# Patient Record
Sex: Female | Born: 1965 | Race: Black or African American | Hispanic: No | Marital: Single | Smoking: Never smoker
Health system: Southern US, Community
[De-identification: ages and names within clinical notes are randomized; demographics above are authoritative.]

## PROBLEM LIST (undated history)

## (undated) DIAGNOSIS — I10 Essential (primary) hypertension: Secondary | ICD-10-CM

## (undated) DIAGNOSIS — W3400XA Accidental discharge from unspecified firearms or gun, initial encounter: Secondary | ICD-10-CM

## (undated) DIAGNOSIS — M722 Plantar fascial fibromatosis: Secondary | ICD-10-CM

## (undated) DIAGNOSIS — M1990 Unspecified osteoarthritis, unspecified site: Secondary | ICD-10-CM

## (undated) DIAGNOSIS — E119 Type 2 diabetes mellitus without complications: Secondary | ICD-10-CM

## (undated) HISTORY — PX: TRACHEOSTOMY: SUR1362

## (undated) HISTORY — PX: TRACHEOSTOMY CLOSURE: SHX458

## (undated) HISTORY — PX: CATARACT EXTRACTION, BILATERAL: SHX1313

## (undated) HISTORY — PX: THYROPLASTY: SHX2512

## (undated) HISTORY — PX: TOE SURGERY: SHX1073

---

## 2008-06-28 ENCOUNTER — Emergency Department (HOSPITAL_COMMUNITY): Admission: EM | Admit: 2008-06-28 | Discharge: 2008-06-28 | Payer: Self-pay | Admitting: Family Medicine

## 2008-12-09 ENCOUNTER — Emergency Department (HOSPITAL_COMMUNITY): Admission: EM | Admit: 2008-12-09 | Discharge: 2008-12-09 | Payer: Self-pay | Admitting: Family Medicine

## 2011-01-27 ENCOUNTER — Inpatient Hospital Stay (INDEPENDENT_AMBULATORY_CARE_PROVIDER_SITE_OTHER)
Admission: RE | Admit: 2011-01-27 | Discharge: 2011-01-27 | Disposition: A | Discharge status: Home / Self Care | Payer: Self-pay | Source: Ambulatory Visit | Attending: Family Medicine | Admitting: Family Medicine

## 2011-01-27 DIAGNOSIS — B373 Candidiasis of vulva and vagina: Secondary | ICD-10-CM

## 2011-01-27 LAB — POCT URINALYSIS DIP (DEVICE)
Glucose, UA: 500 mg/dL — AB
Hgb urine dipstick: NEGATIVE
Nitrite: NEGATIVE
Urobilinogen, UA: 0.2 mg/dL (ref 0.0–1.0)

## 2011-01-27 LAB — POCT I-STAT, CHEM 8
Creatinine, Ser: 0.9 mg/dL (ref 0.4–1.2)
HCT: 47 % — ABNORMAL HIGH (ref 36.0–46.0)
Hemoglobin: 16 g/dL — ABNORMAL HIGH (ref 12.0–15.0)
Sodium: 138 mEq/L (ref 135–145)
TCO2: 23 mmol/L (ref 0–100)

## 2011-01-27 LAB — WET PREP, GENITAL: Trich, Wet Prep: NONE SEEN

## 2011-01-28 ENCOUNTER — Ambulatory Visit (HOSPITAL_COMMUNITY): Admission: RE | Admit: 2011-01-28 | Payer: Self-pay | Source: Ambulatory Visit

## 2011-01-28 ENCOUNTER — Inpatient Hospital Stay (INDEPENDENT_AMBULATORY_CARE_PROVIDER_SITE_OTHER)
Admission: RE | Admit: 2011-01-28 | Discharge: 2011-01-28 | Disposition: A | Discharge status: Home / Self Care | Payer: Self-pay | Source: Ambulatory Visit

## 2011-01-28 ENCOUNTER — Ambulatory Visit (INDEPENDENT_AMBULATORY_CARE_PROVIDER_SITE_OTHER): Payer: Self-pay

## 2011-01-28 ENCOUNTER — Ambulatory Visit (HOSPITAL_COMMUNITY): Payer: Self-pay

## 2011-01-28 DIAGNOSIS — IMO0002 Reserved for concepts with insufficient information to code with codable children: Secondary | ICD-10-CM

## 2011-01-28 DIAGNOSIS — W19XXXA Unspecified fall, initial encounter: Secondary | ICD-10-CM

## 2011-01-28 LAB — GC/CHLAMYDIA PROBE AMP, GENITAL: Chlamydia, DNA Probe: NEGATIVE

## 2011-01-28 IMAGING — CR DG KNEE COMPLETE 4+V*L*
4 series · 4 of 4 positions shown · non-contrast
Comparison: None

CLINICAL DATA: Fall, knee pain, abrasions.

LEFT KNEE - COMPLETE 4+ VIEW

[view not recorded (1 of 4)]
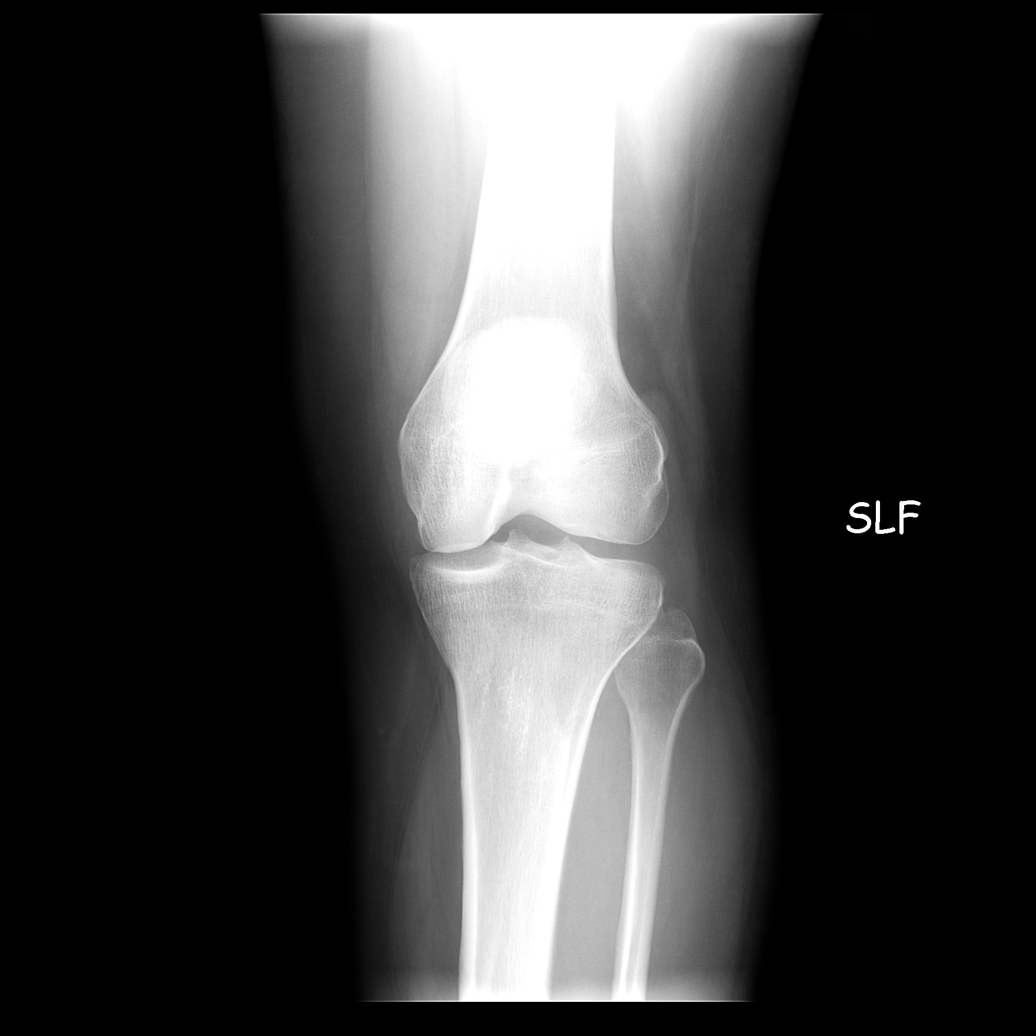

[view not recorded (2 of 4)]
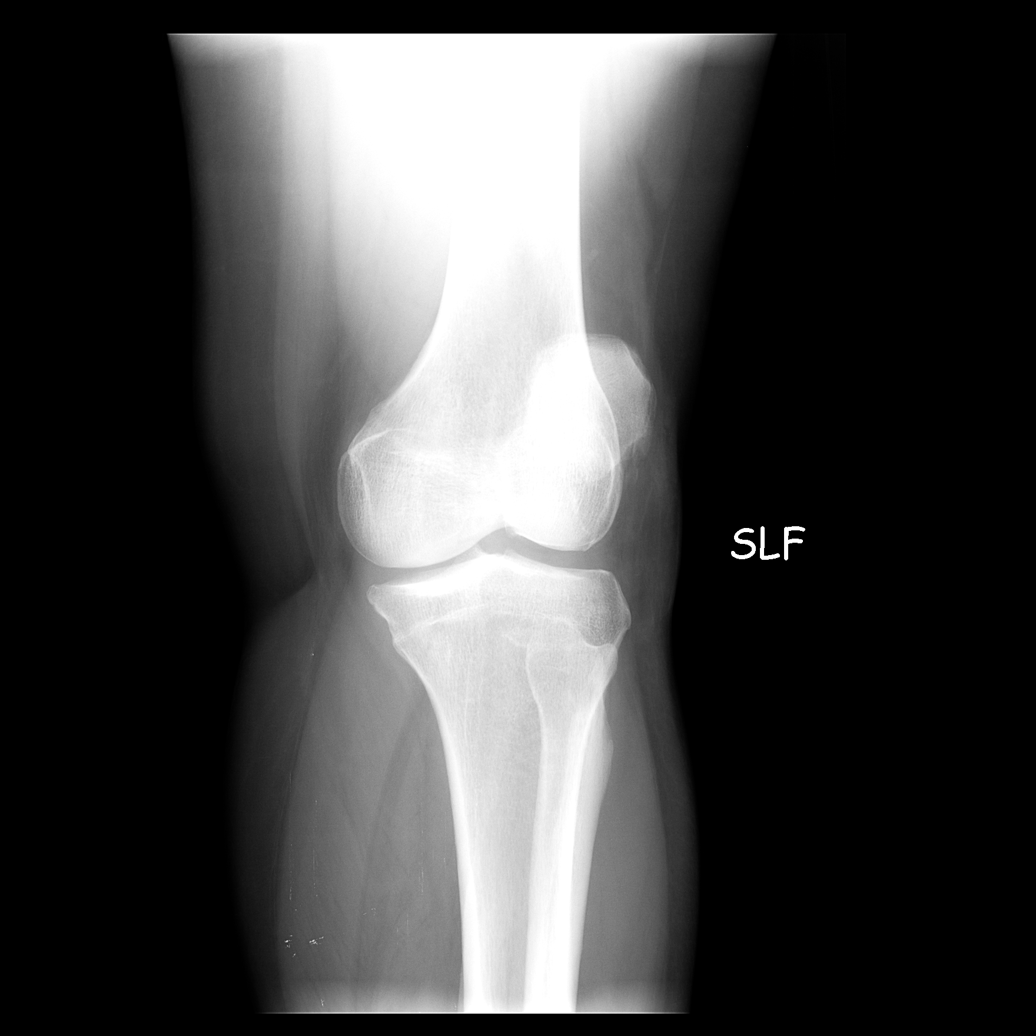

[view not recorded (3 of 4)]
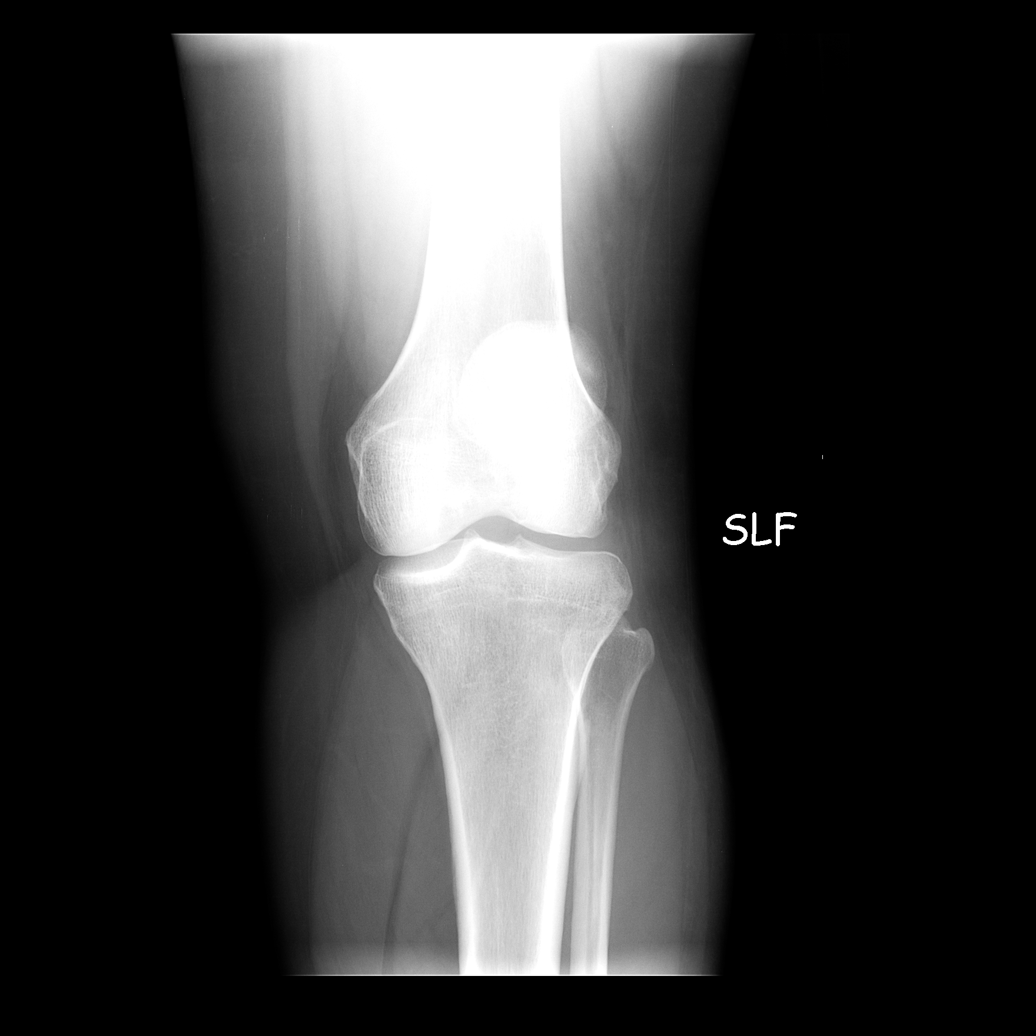

[view not recorded (4 of 4)]
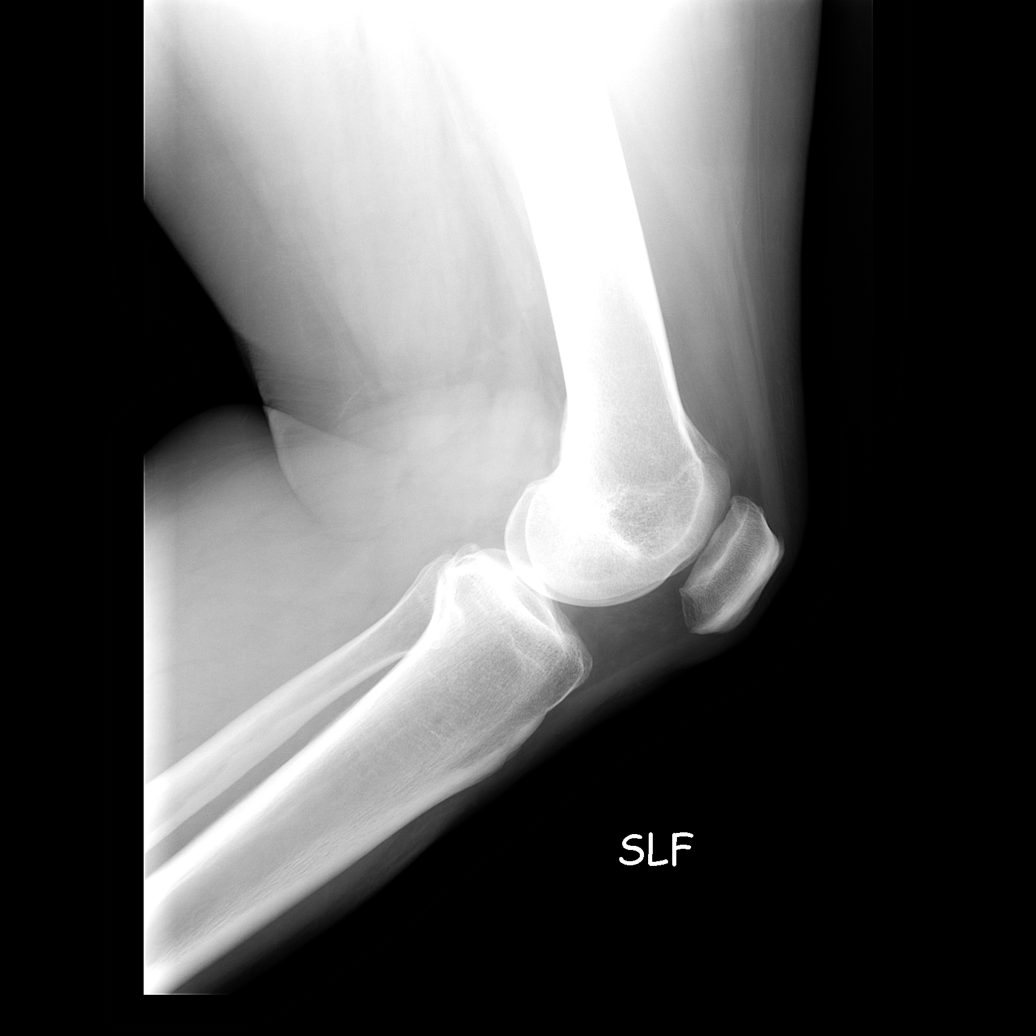

[4 of 4 positions shown; findings below may reference images not displayed]

FINDINGS: No acute bony abnormality.  Specifically, no fracture,
subluxation, or dislocation.  Soft tissues are intact.  No joint
effusion.
IMPRESSION: No acute bony abnormality.

## 2011-01-30 ENCOUNTER — Inpatient Hospital Stay (INDEPENDENT_AMBULATORY_CARE_PROVIDER_SITE_OTHER)
Admission: RE | Admit: 2011-01-30 | Discharge: 2011-01-30 | Disposition: A | Discharge status: Home / Self Care | Payer: Self-pay | Source: Ambulatory Visit | Attending: Emergency Medicine | Admitting: Emergency Medicine

## 2011-01-30 DIAGNOSIS — IMO0002 Reserved for concepts with insufficient information to code with codable children: Secondary | ICD-10-CM

## 2011-03-24 ENCOUNTER — Inpatient Hospital Stay (INDEPENDENT_AMBULATORY_CARE_PROVIDER_SITE_OTHER)
Admission: RE | Admit: 2011-03-24 | Discharge: 2011-03-24 | Disposition: A | Discharge status: Home / Self Care | Payer: Self-pay | Source: Ambulatory Visit | Attending: Family Medicine | Admitting: Family Medicine

## 2011-03-24 DIAGNOSIS — L0291 Cutaneous abscess, unspecified: Secondary | ICD-10-CM

## 2011-06-03 ENCOUNTER — Inpatient Hospital Stay (INDEPENDENT_AMBULATORY_CARE_PROVIDER_SITE_OTHER)
Admission: RE | Admit: 2011-06-03 | Discharge: 2011-06-03 | Disposition: A | Discharge status: Home / Self Care | Payer: Self-pay | Source: Ambulatory Visit | Attending: Emergency Medicine | Admitting: Emergency Medicine

## 2011-06-03 DIAGNOSIS — K089 Disorder of teeth and supporting structures, unspecified: Secondary | ICD-10-CM

## 2011-06-03 DIAGNOSIS — E119 Type 2 diabetes mellitus without complications: Secondary | ICD-10-CM

## 2011-06-03 DIAGNOSIS — L0292 Furuncle, unspecified: Secondary | ICD-10-CM

## 2011-06-03 DIAGNOSIS — N760 Acute vaginitis: Secondary | ICD-10-CM

## 2011-06-03 LAB — POCT URINALYSIS DIP (DEVICE)
Glucose, UA: 500 mg/dL — AB
Ketones, ur: NEGATIVE mg/dL
Protein, ur: NEGATIVE mg/dL
Specific Gravity, Urine: 1.015 (ref 1.005–1.030)

## 2011-06-03 LAB — GLUCOSE, CAPILLARY: Glucose-Capillary: 326 mg/dL — ABNORMAL HIGH (ref 70–99)

## 2011-06-04 LAB — POCT PREGNANCY, URINE: Preg Test, Ur: NEGATIVE

## 2012-04-04 ENCOUNTER — Emergency Department (INDEPENDENT_AMBULATORY_CARE_PROVIDER_SITE_OTHER)
Admission: EM | Admit: 2012-04-04 | Discharge: 2012-04-04 | Disposition: A | Discharge status: Other Acute Inpatient Hospital | Payer: Self-pay | Source: Home / Self Care | Attending: Emergency Medicine | Admitting: Emergency Medicine

## 2012-04-04 ENCOUNTER — Encounter (HOSPITAL_COMMUNITY): Payer: Self-pay | Admitting: Emergency Medicine

## 2012-04-04 ENCOUNTER — Encounter (HOSPITAL_COMMUNITY): Payer: Self-pay

## 2012-04-04 ENCOUNTER — Emergency Department (HOSPITAL_COMMUNITY)
Admission: EM | Admit: 2012-04-04 | Discharge: 2012-04-04 | Disposition: A | Discharge status: Home / Self Care | Payer: Self-pay | Attending: Emergency Medicine | Admitting: Emergency Medicine

## 2012-04-04 ENCOUNTER — Emergency Department (HOSPITAL_COMMUNITY)
Admission: EM | Admit: 2012-04-04 | Discharge: 2012-04-04 | Disposition: A | Discharge status: Other Acute Inpatient Hospital | Payer: Self-pay | Source: Home / Self Care

## 2012-04-04 DIAGNOSIS — I10 Essential (primary) hypertension: Secondary | ICD-10-CM | POA: Insufficient documentation

## 2012-04-04 DIAGNOSIS — R202 Paresthesia of skin: Secondary | ICD-10-CM

## 2012-04-04 DIAGNOSIS — R209 Unspecified disturbances of skin sensation: Secondary | ICD-10-CM | POA: Insufficient documentation

## 2012-04-04 DIAGNOSIS — R7309 Other abnormal glucose: Secondary | ICD-10-CM | POA: Insufficient documentation

## 2012-04-04 DIAGNOSIS — R739 Hyperglycemia, unspecified: Secondary | ICD-10-CM

## 2012-04-04 HISTORY — DX: Accidental discharge from unspecified firearms or gun, initial encounter: W34.00XA

## 2012-04-04 LAB — CBC
HCT: 42.5 % (ref 36.0–46.0)
Hemoglobin: 13.9 g/dL (ref 12.0–15.0)
RBC: 5.8 MIL/uL — ABNORMAL HIGH (ref 3.87–5.11)
WBC: 8.2 10*3/uL (ref 4.0–10.5)

## 2012-04-04 LAB — DIFFERENTIAL
Basophils Absolute: 0 10*3/uL (ref 0.0–0.1)
Basophils Relative: 0 % (ref 0–1)
Lymphocytes Relative: 13 % (ref 12–46)
Monocytes Relative: 2 % — ABNORMAL LOW (ref 3–12)
Neutro Abs: 6.9 10*3/uL (ref 1.7–7.7)

## 2012-04-04 LAB — POCT I-STAT, CHEM 8
Chloride: 102 mEq/L (ref 96–112)
HCT: 50 % — ABNORMAL HIGH (ref 36.0–46.0)
Potassium: 3.9 mEq/L (ref 3.5–5.1)

## 2012-04-04 MED ORDER — METFORMIN HCL 500 MG PO TABS: 500.0000 mg | ORAL_TABLET | Freq: Two times a day (BID) | ORAL | Status: DC

## 2012-04-04 MED ORDER — PREDNISONE 20 MG PO TABS
40.0000 mg | ORAL_TABLET | Freq: Once | ORAL | Status: AC
  Administered 2012-04-04: 40 mg via ORAL

## 2012-04-04 MED ORDER — PREDNISONE 20 MG PO TABS
ORAL_TABLET | ORAL | Status: AC
  Filled 2012-04-04: qty 2

## 2012-04-04 MED ORDER — HYDROCHLOROTHIAZIDE 25 MG PO TABS: 25.0000 mg | ORAL_TABLET | Freq: Every day | ORAL | Status: DC

## 2012-04-04 NOTE — Discharge Instructions (Signed)
Arterial Hypertension Arterial hypertension (high blood pressure) is a condition of elevated pressure in your blood vessels. Hypertension over a long period of time is a risk factor for strokes, heart attacks, and heart failure. It is also the leading cause of kidney (renal) failure.  CAUSES   In Adults -- Over 90% of all hypertension has no known cause. This is called essential or primary hypertension. In the other 10% of people with hypertension, the increase in blood pressure is caused by another disorder. This is called secondary hypertension. Important causes of secondary hypertension are:   Heavy alcohol use.   Obstructive sleep apnea.   Hyperaldosterosim (Conn's syndrome).   Steroid use.   Chronic kidney failure.   Hyperparathyroidism.   Medications.   Renal artery stenosis.   Pheochromocytoma.   Cushing's disease.   Coarctation of the aorta.   Scleroderma renal crisis.   Licorice (in excessive amounts).   Drugs (cocaine, methamphetamine).  Your caregiver can explain any items above that apply to you.  In Children -- Secondary hypertension is more common and should always be considered.   Pregnancy -- Few women of childbearing age have high blood pressure. However, up to 10% of them develop hypertension of pregnancy. Generally, this will not harm the woman. It Even be a sign of 3 complications of pregnancy: preeclampsia, HELLP syndrome, and eclampsia. Follow up and control with medication is necessary.  SYMPTOMS   This condition normally does not produce any noticeable symptoms. It is usually found during a routine exam.   Malignant hypertension is a late problem of high blood pressure. It Monger have the following symptoms:   Headaches.   Blurred vision.   End-organ damage (this means your kidneys, heart, lungs, and other organs are being damaged).   Stressful situations can increase the blood pressure. If a person with normal blood pressure has their blood  pressure go up while being seen by their caregiver, this is often termed "white coat hypertension." Its importance is not known. It Alkire be related with eventually developing hypertension or complications of hypertension.   Hypertension is often confused with mental tension, stress, and anxiety.  DIAGNOSIS  The diagnosis is made by 3 separate blood pressure measurements. They are taken at least 1 week apart from each other. If there is organ damage from hypertension, the diagnosis Lemire be made without repeat measurements. Hypertension is usually identified by having blood pressure readings:  Above 140/90 mmHg measured in both arms, at 3 separate times, over a couple weeks.   Over 130/80 mmHg should be considered a risk factor and Grisanti require treatment in patients with diabetes.  Blood pressure readings over 120/80 mmHg are called "pre-hypertension" even in non-diabetic patients. To get a true blood pressure measurement, use the following guidelines. Be aware of the factors that can alter blood pressure readings.  Take measurements at least 1 hour after caffeine.   Take measurements 30 minutes after smoking and without any stress. This is another reason to quit smoking - it raises your blood pressure.   Use a proper cuff size. Ask your caregiver if you are not sure about your cuff size.   Most home blood pressure cuffs are automatic. They will measure systolic and diastolic pressures. The systolic pressure is the pressure reading at the start of sounds. Diastolic pressure is the pressure at which the sounds disappear. If you are elderly, measure pressures in multiple postures. Try sitting, lying or standing.   Sit at rest for a minimum of   5 minutes before taking measurements.   You should not be on any medications like decongestants. These are found in many cold medications.   Record your blood pressure readings and review them with your caregiver.  If you have hypertension:  Your caregiver  may do tests to be sure you do not have secondary hypertension (see "causes" above).   Your caregiver may also look for signs of metabolic syndrome. This is also called Syndrome X or Insulin Resistance Syndrome. You may have this syndrome if you have type 2 diabetes, abdominal obesity, and abnormal blood lipids in addition to hypertension.   Your caregiver will take your medical and family history and perform a physical exam.   Diagnostic tests may include blood tests (for glucose, cholesterol, potassium, and kidney function), a urinalysis, or an EKG. Other tests may also be necessary depending on your condition.  PREVENTION  There are important lifestyle issues that you can adopt to reduce your chance of developing hypertension:  Maintain a normal weight.   Limit the amount of salt (sodium) in your diet.   Exercise often.   Limit alcohol intake.   Get enough potassium in your diet. Discuss specific advice with your caregiver.   Follow a DASH diet (dietary approaches to stop hypertension). This diet is rich in fruits, vegetables, and low-fat dairy products, and avoids certain fats.  PROGNOSIS  Essential hypertension cannot be cured. Lifestyle changes and medical treatment can lower blood pressure and reduce complications. The prognosis of secondary hypertension depends on the underlying cause. Many people whose hypertension is controlled with medicine or lifestyle changes can live a normal, healthy life.  RISKS AND COMPLICATIONS  While high blood pressure alone is not an illness, it often requires treatment due to its short- and long-term effects on many organs. Hypertension increases your risk for:  CVAs or strokes (cerebrovascular accident).   Heart failure due to chronically high blood pressure (hypertensive cardiomyopathy).   Heart attack (myocardial infarction).   Damage to the retina (hypertensive retinopathy).   Kidney failure (hypertensive nephropathy).  Your caregiver can  explain list items above that apply to you. Treatment of hypertension can significantly reduce the risk of complications. TREATMENT   For overweight patients, weight loss and regular exercise are recommended. Physical fitness lowers blood pressure.   Mild hypertension is usually treated with diet and exercise. A diet rich in fruits and vegetables, fat-free dairy products, and foods low in fat and salt (sodium) can help lower blood pressure. Decreasing salt intake decreases blood pressure in a 1/3 of people.   Stop smoking if you are a smoker.  The steps above are highly effective in reducing blood pressure. While these actions are easy to suggest, they are difficult to achieve. Most patients with moderate or severe hypertension end up requiring medications to bring their blood pressure down to a normal level. There are several classes of medications for treatment. Blood pressure pills (antihypertensives) will lower blood pressure by their different actions. Lowering the blood pressure by 10 mmHg may decrease the risk of complications by as much as 25%. The goal of treatment is effective blood pressure control. This will reduce your risk for complications. Your caregiver will help you determine the best treatment for you according to your lifestyle. What is excellent treatment for one person, may not be for you. HOME CARE INSTRUCTIONS   Do not smoke.   Follow the lifestyle changes outlined in the "Prevention" section.   If you are on medications, follow the directions   carefully. Blood pressure medications must be taken as prescribed. Skipping doses reduces their benefit. It also puts you at risk for problems.   Follow up with your caregiver, as directed.   If you are asked to monitor your blood pressure at home, follow the guidelines in the "Diagnosis" section above.  SEEK MEDICAL CARE IF:   You think you are having medication side effects.   You have recurrent headaches or lightheadedness.    You have swelling in your ankles.   You have trouble with your vision.  SEEK IMMEDIATE MEDICAL CARE IF:   You have sudden onset of chest pain or pressure, difficulty breathing, or other symptoms of a heart attack.   You have a severe headache.   You have symptoms of a stroke (such as sudden weakness, difficulty speaking, difficulty walking).  MAKE SURE YOU:   Understand these instructions.   Will watch your condition.   Will get help right away if you are not doing well or get worse.  Document Released: 10/18/2005 Document Revised: 10/07/2011 Document Reviewed: 05/18/2007 [facility] Patient Information 2012 [place], [place].Hyperglycemia Hyperglycemia occurs when the glucose (sugar) in your blood is too high. Hyperglycemia can happen for many reasons, but it most often happens to people who do not know they have diabetes or are not managing their diabetes properly.  CAUSES  Whether you have diabetes or not, there are other causes of hyperglycemia. Hyperglycemia can occur when you have diabetes, but it can also occur in other situations that you might not be as aware of, such as: Diabetes  If you have diabetes and are having problems controlling your blood glucose, hyperglycemia could occur because of some of the following reasons:   Not following your meal plan.   Not taking your diabetes medications or not taking it properly.   Exercising less or doing less activity than you normally do.   Being sick.  Pre-diabetes  This cannot be ignored. Before people develop Type 2 diabetes, they almost always have "pre-diabetes." This is when your blood glucose levels are higher than normal, but not yet high enough to be diagnosed as diabetes. Research has shown that some long-term damage to the body, especially the heart and circulatory system, may already be occurring during pre-diabetes. If you take action to manage your blood glucose when you have pre-diabetes, you may delay or prevent  Type 2 diabetes from developing.  Stress  If you have diabetes, you may be "diet" controlled or on oral medications or insulin to control your diabetes. However, you may find that your blood glucose is higher than usual in the hospital whether you have diabetes or not. This is often referred to as "stress hyperglycemia." Stress can elevate your blood glucose. This happens because of hormones put out by the body during times of stress. If stress has been the cause of your high blood glucose, it can be followed regularly by your caregiver. That way he/she can make sure your hyperglycemia does not continue to get worse or progress to diabetes.  Steroids  Steroids are medications that act on the infection fighting system (immune system) to block inflammation or infection. One side effect can be a rise in blood glucose. Most people can produce enough extra insulin to allow for this rise, but for those who cannot, steroids make blood glucose levels go even higher. It is not unusual for steroid treatments to "uncover" diabetes that is developing. It is not always possible to determine if the hyperglycemia will go away  after the steroids are stopped. A special blood test called an A1c is sometimes done to determine if your blood glucose was elevated before the steroids were started.  SYMPTOMS  Thirsty.   Frequent urination.   Dry mouth.   Blurred vision.   Tired or fatigue.   Weakness.   Sleepy.   Tingling in feet or leg.  DIAGNOSIS  Diagnosis is made by monitoring blood glucose in one or all of the following ways:  A1c test. This is a chemical found in your blood.   Fingerstick blood glucose monitoring.   Laboratory results.  TREATMENT  First, knowing the cause of the hyperglycemia is important before the hyperglycemia can be treated. Treatment may include, but is not be limited to:  Education.   Change or adjustment in medications.   Change or adjustment in meal plan.   Treatment  for an illness, infection, etc.   More frequent blood glucose monitoring.   Change in exercise plan.   Decreasing or stopping steroids.   Lifestyle changes.  HOME CARE INSTRUCTIONS   Test your blood glucose as directed.   Exercise regularly. Your caregiver will give you instructions about exercise. Pre-diabetes or diabetes which comes on with stress is helped by exercising.   Eat wholesome, balanced meals. Eat often and at regular, fixed times. Your caregiver or nutritionist will give you a meal plan to guide your sugar intake.   Being at an ideal weight is important. If needed, losing as little as 10 to 15 pounds may help improve blood glucose levels.  SEEK MEDICAL CARE IF:   You have questions about medicine, activity, or diet.   You continue to have symptoms (problems such as increased thirst, urination, or weight gain).  SEEK IMMEDIATE MEDICAL CARE IF:   You are vomiting or have diarrhea.   Your breath smells fruity.   You are breathing faster or slower.   You are very sleepy or incoherent.   You have numbness, tingling, or pain in your feet or hands.   You have chest pain.   Your symptoms get worse even though you have been following your caregiver's orders.   If you have any other questions or concerns.  Document Released: 04/13/2001 Document Revised: 10/07/2011 Document Reviewed: 06/09/2009 [facility] Patient Information 2012 [place], [place].Paresthesia Paresthesia is an abnormal burning or prickling sensation. This sensation is generally felt in the hands, arms, legs, or feet. However, it may occur in any part of the body. It is usually not painful. The feeling may be described as:  Tingling or numbness.   "Pins and needles."   Skin crawling.   Buzzing.   Limbs "falling asleep."   Itching.  Most people experience temporary (transient) paresthesia at some time in their lives. CAUSES  Paresthesia may occur when you breathe too quickly (hyperventilation).  It can also occur without any apparent cause. Commonly, paresthesia occurs when pressure is placed on a nerve. The feeling quickly goes away once the pressure is removed. For some people, however, paresthesia is a long-lasting (chronic) condition caused by an underlying disorder. The underlying disorder may be:  A traumatic, direct injury to nerves. Examples include a:   Broken (fractured) neck.   Fractured skull.   A disorder affecting the brain and spinal cord (central nervous system). Examples include:   Transverse myelitis.   Encephalitis.   Transient ischemic attack.   Multiple sclerosis.   Stroke.   Tumor or blood vessel problems, such as an arteriovenous malformation pressing against the brain  or spinal cord.   A condition that damages the peripheral nerves (peripheral neuropathy). Peripheral nerves are not part of the brain and spinal cord. These conditions include:   Diabetes.   Peripheral vascular disease.   Nerve entrapment syndromes, such as carpal tunnel syndrome.   Shingles.   Hypothyroidism.   Vitamin B12 deficiencies.   Alcoholism.   Heavy metal poisoning (lead, arsenic).   Rheumatoid arthritis.   Systemic lupus erythematosus.  DIAGNOSIS  Your caregiver will attempt to find the underlying cause of your paresthesia. Your caregiver may:  Take your medical history.   Perform a physical exam.   Order various lab tests.   Order imaging tests.  TREATMENT  Treatment for paresthesia depends on the underlying cause. HOME CARE INSTRUCTIONS  Avoid drinking alcohol.   You may consider massage or acupuncture to help relieve your symptoms.   Keep all follow-up appointments as directed by your caregiver.  SEEK IMMEDIATE MEDICAL CARE IF:   You feel weak.   You have trouble walking or moving.   You have problems with speech or vision.   You feel confused.   You cannot control your bladder or bowel movements.   You feel numbness after an injury.    You faint.   Your burning or prickling feeling gets worse when walking.   You have pain, cramps, or dizziness.   You develop a rash.  MAKE SURE YOU:  Understand these instructions.   Will watch your condition.   Will get help right away if you are not doing well or get worse.  Document Released: 10/08/2002 Document Revised: 10/07/2011 Document Reviewed: 07/09/2011 [facility] Patient Information 2012 [place], [place].  RESOURCE GUIDE  Chronic Pain Problems: Contact [facility]  [phone] Patients need to be referred by their primary care doctor.  Insufficient Money for Medicine: Contact United Way:  call "211" or [facility] (380) [phone].  No Primary Care Doctor: - Call [facility] Connect  [phone] - can help you locate a primary care doctor that  accepts your insurance, provides certain services, etc. - Physician Referral [phone]  Agencies that provide inexpensive medical care: - [facility]  [phone] - [facility]  [phone] - [facility]  [phone] [facility]  [phone] - Planned Parenthood  [phone] [facility]  [phone]  Medicaid-accepting [facility] Providers: - [facility]- [address]  [phone], Mon-Fri 9am-7pm, Sat 9am-1pm - [facility]- [address] [place], [address] [place]  [phone] - [facility]- [address] [place]  [phone] [facility]- [address]  [phone] - Renaye Rakers- [address] [place], [address], [phone]  Only accepts [place] patients after they have their name  applied to their card  Self Pay (no insurance) in [place]: - Sickle Cell Patients: Dr Willey Blade, [facility]  [address] [place], [phone] - [facility]- [address] [place]  [phone]       [facility]  [place]- [address]       -     [facility]- see information above (Speak to [employer] if you do not have insurance)       -  [facility]- [address] [place], [phone]       -  [facility]- [address],  [phone]       -  [facility]- [address] [phone]       -  Dr Julio Sicks-  [address], [place], [phone]       -  [facility]- [address], [phone]       -  [facility]- [address] [phone], also [address] [phone]       -    [facility]- [address] [place], [phone], 1st & 3rd Saturday   every month, 10am-1pm  1) Find a Doctor and Pay Out of Pocket Although you won't have to find out who is covered by your insurance plan, it is a good idea to ask around and get recommendations. You will then need to call the office and see if the doctor you have chosen will accept you as a new patient and what types of options they offer for patients who are self-pay. Some doctors offer discounts or will set up payment plans for their patients who do not have insurance, but you will need to ask so you aren't surprised when you get to your appointment.  2) Contact Your Local Health Department Not all health departments have doctors that can see patients for sick visits, but many do, so it is worth a call to see if yours does. If you don't know where your local health department is, you can check in your phone book. The CDC also has a tool to help you locate your state's health department, and many state websites also have listings of all of their local health departments.  3) Find a Walk-in Clinic If your illness is not likely to be very severe or complicated, you may want to try a walk in clinic. These are popping up all over the country in pharmacies, drugstores, and shopping centers. They're usually staffed by nurse practitioners or physician assistants that have  been trained to treat common illnesses and complaints. They're usually fairly quick and inexpensive. However, if you have serious medical issues or chronic medical problems, these are probably not your best option  STD Testing - [facility] Department of [facility], STD Clinic, [address] [place], phone [phone] or [phone].  Monday - Friday, call for an appointment. [facility] Department of [employer], STD Clinic, [address], [place], phone [phone] or [phone].  Monday - Friday, call for an appointment.  Abuse/Neglect: [facility] Child Abuse Hotline 325-393-5853 [facility] Child Abuse Hotline (425) 886-1434 (After Hours)  Emergency Shelter:  [facility] (815)832-1938  Maternity Homes: - [facility] (732) 679-0652 - [facility] [phone]  MRSA Hotline #:   787-870-6399  [facility] Resources  [facility]  [employer] [facility] Dept. [address]                 [address]         [address] [place] [address]  [address]  [facility] Phone:  [phone]                                  Phone:  [phone]                   Phone:  [phone]  [facility], [phone] - [facility] - [phone]       -     [facility] in [place], [address]                                  [phone], [facility] Child Abuse Hotline (804)010-0292 or 763 695 1685 (After Hours)   Behavioral Health Services  Substance Abuse Resources: - [facility] Drug [facility]  (807)392-3797 - [facility] [phone] - [facility] [phone] Floydene Flock (443) 522-6144 - Residential & Outpatient Substance Abuse Program   [phone]  Psychological Services: [facility]  [phone] [facility] Services  [phone] - [facility], [phone] [place]. [address], [place], ACCESS LINE: (314) 414-4143 or (781)162-6364, [employer]  Dental Assistance  If unable to pay or uninsured, contact:  [facility] or [facility]. to become qualified for the adult dental clinic.  Patients with Medicaid: [facility] [phone], [phone] [address] [phone]  If unable to pay, or uninsured, contact [facility] 480-769-0641) or [facility] Department [phone])[phone] in [place], [phone] in [facility]) to become qualified for the adult dental clinic  Other Low-Cost Community Dental Services: - [facility]- [address] [place], [place], [place], [address], [phone], [phone], 2nd and 4th Thursday of the month at 6:30am.  10 clients each day by appointment, can sometimes see walk-in patients if someone does not show for an appointment. [place] [facility]- [address] Ether Griffins [place], [place], [address], [phone] - [facility]- [address], [place], [place], [address], [phone] - [facility]- [phone] [facility] Health Department- [phone] [facility] [phone] -

## 2012-04-04 NOTE — ED Notes (Signed)
Pt sent from [facility] with generalized body numbness; pt with no obvious neuro deficits at present
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
## 2012-04-04 NOTE — ED Notes (Signed)
Waiting for transport to main ED via shuttle

## 2012-04-04 NOTE — ED Notes (Addendum)
C/o generalized numbness when she woke this AM, took warm shower in an attempt to relieve syx, but still feels generalized numbness, denies focal numb sensation. MAEW, no facial weakness on grimace , ambulatory ; denies injury; states she had been having pain in her arms, but denies any at present Concerned about "bad tooth on right"

## 2012-04-04 NOTE — ED Notes (Signed)
Kathleen Mcmillan, emt reported patient wanted to speak to an rn.  generalized numbness today.  Patient reports some nausea, denies pain.  C/o of heartrate being fast, plapable radial pulse 100.  Patient denies recent illness.  Left face numbness.  Patient states "it may be anxiety".  Patient's phone ringing frequently.  Patient requesting to answer her phone during rn assessment.

## 2014-10-13 ENCOUNTER — Encounter (HOSPITAL_COMMUNITY): Payer: Self-pay

## 2014-10-13 ENCOUNTER — Emergency Department (INDEPENDENT_AMBULATORY_CARE_PROVIDER_SITE_OTHER)
Admission: EM | Admit: 2014-10-13 | Discharge: 2014-10-13 | Disposition: A | Discharge status: Home / Self Care | Payer: Self-pay | Source: Home / Self Care | Attending: Emergency Medicine | Admitting: Emergency Medicine

## 2014-10-13 DIAGNOSIS — T148XXA Other injury of unspecified body region, initial encounter: Secondary | ICD-10-CM

## 2014-10-13 DIAGNOSIS — T148 Other injury of unspecified body region: Secondary | ICD-10-CM

## 2014-10-13 DIAGNOSIS — R0981 Nasal congestion: Secondary | ICD-10-CM

## 2014-10-13 MED ORDER — TRAMADOL HCL 50 MG PO TABS: 50.0000 mg | ORAL_TABLET | Freq: Four times a day (QID) | ORAL | Status: DC | PRN

## 2014-10-13 MED ORDER — IPRATROPIUM BROMIDE 0.06 % NA SOLN: 2.0000 | Freq: Four times a day (QID) | NASAL | Status: DC

## 2014-10-13 MED ORDER — MELOXICAM 15 MG PO TABS: 15.0000 mg | ORAL_TABLET | Freq: Every day | ORAL | Status: DC

## 2014-10-13 MED ORDER — CETIRIZINE HCL 10 MG PO TABS: 10.0000 mg | ORAL_TABLET | Freq: Every day | ORAL | Status: DC

## 2014-10-13 MED ORDER — AMOXICILLIN-POT CLAVULANATE 875-125 MG PO TABS: 1.0000 | ORAL_TABLET | Freq: Two times a day (BID) | ORAL | Status: DC

## 2014-10-13 MED ORDER — CYCLOBENZAPRINE HCL 10 MG PO TABS: 10.0000 mg | ORAL_TABLET | Freq: Three times a day (TID) | ORAL | Status: DC | PRN

## 2014-10-13 NOTE — Discharge Instructions (Signed)
You have muscle strain and spasm from the fall. Take flexeril 3 times a day as needed for muscle spasm. Take meloxicam daily for the next week, then as needed. Use Tramadol as needed for pain. Use the Zyrtec and nasal spray for the sinus issue. If the sinuses do not resolve in the next 4-5 days, you can fill the antibiotic. Follow up as needed.
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
## 2014-10-13 NOTE — ED Notes (Signed)
States she fell Tuesday, landed on her left side, chipped a tooth on left side of mouth, bit her tongue, and having sinus pain on right side

## 2014-12-07 ENCOUNTER — Emergency Department (INDEPENDENT_AMBULATORY_CARE_PROVIDER_SITE_OTHER)
Admission: EM | Admit: 2014-12-07 | Discharge: 2014-12-07 | Disposition: A | Discharge status: Home / Self Care | Payer: Self-pay | Source: Home / Self Care | Attending: Family Medicine | Admitting: Family Medicine

## 2014-12-07 ENCOUNTER — Encounter (HOSPITAL_COMMUNITY): Payer: Self-pay | Admitting: *Deleted

## 2014-12-07 DIAGNOSIS — K047 Periapical abscess without sinus: Secondary | ICD-10-CM

## 2014-12-07 HISTORY — DX: Essential (primary) hypertension: I10

## 2014-12-07 MED ORDER — FLUCONAZOLE 150 MG PO TABS: 150.0000 mg | ORAL_TABLET | Freq: Every day | ORAL | Status: DC

## 2014-12-07 MED ORDER — CLINDAMYCIN HCL 300 MG PO CAPS: 300.0000 mg | ORAL_CAPSULE | Freq: Three times a day (TID) | ORAL | Status: DC

## 2014-12-07 MED ORDER — IBUPROFEN 800 MG PO TABS
ORAL_TABLET | ORAL | Status: AC
  Filled 2014-12-07: qty 1

## 2014-12-07 MED ORDER — IBUPROFEN 800 MG PO TABS
800.0000 mg | ORAL_TABLET | Freq: Once | ORAL | Status: AC
  Administered 2014-12-07: 800 mg via ORAL

## 2014-12-07 NOTE — ED Notes (Signed)
Has been having left lower toothache; saw dentist 4 days ago - was told she needs to return for cleaning and they would like to pull tooth, but did not place on abx.  3 days ago crown fell off, has been having worse pain and now swelling since.  Has been using H2O2 and salt water gargles.

## 2014-12-07 NOTE — Discharge Instructions (Signed)
You have a tooth and gum infection. Please start taking the antibiotics, and taken for the full 10 days. Please use ibuprofen 600 mg every 6 hours for pain and swelling. Please use a daily mouthwash. Please follow-up with the dentist regarding tooth extraction. Please use the Diflucan if you develop a yeast infection.
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
## 2016-01-11 ENCOUNTER — Encounter (HOSPITAL_COMMUNITY): Payer: Self-pay | Admitting: Emergency Medicine

## 2016-01-11 ENCOUNTER — Emergency Department (HOSPITAL_COMMUNITY)
Admission: EM | Admit: 2016-01-11 | Discharge: 2016-01-11 | Disposition: A | Discharge status: Home / Self Care | Payer: Self-pay | Attending: Emergency Medicine | Admitting: Emergency Medicine

## 2016-01-11 DIAGNOSIS — Z792 Long term (current) use of antibiotics: Secondary | ICD-10-CM | POA: Insufficient documentation

## 2016-01-11 DIAGNOSIS — Z87828 Personal history of other (healed) physical injury and trauma: Secondary | ICD-10-CM | POA: Insufficient documentation

## 2016-01-11 DIAGNOSIS — R079 Chest pain, unspecified: Secondary | ICD-10-CM | POA: Insufficient documentation

## 2016-01-11 DIAGNOSIS — K0381 Cracked tooth: Secondary | ICD-10-CM | POA: Insufficient documentation

## 2016-01-11 DIAGNOSIS — E1165 Type 2 diabetes mellitus with hyperglycemia: Secondary | ICD-10-CM | POA: Insufficient documentation

## 2016-01-11 DIAGNOSIS — K002 Abnormalities of size and form of teeth: Secondary | ICD-10-CM | POA: Insufficient documentation

## 2016-01-11 DIAGNOSIS — Z79899 Other long term (current) drug therapy: Secondary | ICD-10-CM | POA: Insufficient documentation

## 2016-01-11 DIAGNOSIS — Z7982 Long term (current) use of aspirin: Secondary | ICD-10-CM | POA: Insufficient documentation

## 2016-01-11 DIAGNOSIS — Z880 Allergy status to penicillin: Secondary | ICD-10-CM | POA: Insufficient documentation

## 2016-01-11 DIAGNOSIS — Z7984 Long term (current) use of oral hypoglycemic drugs: Secondary | ICD-10-CM | POA: Insufficient documentation

## 2016-01-11 DIAGNOSIS — I10 Essential (primary) hypertension: Secondary | ICD-10-CM | POA: Insufficient documentation

## 2016-01-11 DIAGNOSIS — Z791 Long term (current) use of non-steroidal anti-inflammatories (NSAID): Secondary | ICD-10-CM | POA: Insufficient documentation

## 2016-01-11 DIAGNOSIS — K029 Dental caries, unspecified: Secondary | ICD-10-CM | POA: Insufficient documentation

## 2016-01-11 DIAGNOSIS — D849 Immunodeficiency, unspecified: Secondary | ICD-10-CM | POA: Insufficient documentation

## 2016-01-11 LAB — BASIC METABOLIC PANEL
Anion gap: 12 (ref 5–15)
BUN: 9 mg/dL (ref 6–20)
CALCIUM: 9.3 mg/dL (ref 8.9–10.3)
CO2: 26 mmol/L (ref 22–32)
Chloride: 98 mmol/L — ABNORMAL LOW (ref 101–111)
Creatinine, Ser: 0.76 mg/dL (ref 0.44–1.00)
GFR calc Af Amer: >60 mL/min (ref >60)
GLUCOSE: 384 mg/dL — AB (ref 65–99)
Potassium: 3.9 mmol/L (ref 3.5–5.1)
SODIUM: 136 mmol/L (ref 135–145)

## 2016-01-11 LAB — URINALYSIS, ROUTINE W REFLEX MICROSCOPIC
BILIRUBIN URINE: NEGATIVE
Glucose, UA: >1000 mg/dL — AB
Hgb urine dipstick: NEGATIVE
KETONES UR: NEGATIVE mg/dL
LEUKOCYTES UA: NEGATIVE
NITRITE: NEGATIVE
PROTEIN: NEGATIVE mg/dL
Specific Gravity, Urine: 1.046 — ABNORMAL HIGH (ref 1.005–1.030)
pH: 6.5 (ref 5.0–8.0)

## 2016-01-11 LAB — CBC
HCT: 44.3 % (ref 36.0–46.0)
Hemoglobin: 13.9 g/dL (ref 12.0–15.0)
MCH: 22.9 pg — ABNORMAL LOW (ref 26.0–34.0)
MCHC: 31.4 g/dL (ref 30.0–36.0)
MCV: 73.1 fL — ABNORMAL LOW (ref 78.0–100.0)
PLATELETS: 285 10*3/uL (ref 150–400)
RBC: 6.06 MIL/uL — ABNORMAL HIGH (ref 3.87–5.11)
RDW: 13.5 % (ref 11.5–15.5)
WBC: 6.5 10*3/uL (ref 4.0–10.5)

## 2016-01-11 LAB — URINE MICROSCOPIC-ADD ON
BACTERIA UA: NONE SEEN
RBC / HPF: NONE SEEN RBC/hpf (ref 0–5)
WBC, UA: NONE SEEN WBC/hpf (ref 0–5)

## 2016-01-11 LAB — I-STAT TROPONIN, ED
TROPONIN I, POC: 0 ng/mL (ref 0.00–0.08)
Troponin i, poc: 0 ng/mL (ref 0.00–0.08)

## 2016-01-11 MED ORDER — PENICILLIN V POTASSIUM 500 MG PO TABS: 500.0000 mg | ORAL_TABLET | Freq: Three times a day (TID) | ORAL | Status: AC

## 2016-01-11 MED ORDER — SODIUM CHLORIDE 0.9 % IV BOLUS (SEPSIS)
2000.0000 mL | Freq: Once | INTRAVENOUS | Status: AC
  Administered 2016-01-11: 2000 mL via INTRAVENOUS

## 2016-01-11 MED ORDER — METFORMIN HCL 500 MG PO TABS: 500.0000 mg | ORAL_TABLET | Freq: Two times a day (BID) | ORAL | Status: DC

## 2016-01-11 NOTE — Discharge Instructions (Signed)
Call [facility] tomorrow to schedule next available follow-up appointment. You can discuss with them alternatives to metformin if metformin has side effects. You should also get your blood pressure and blood sugar rechecked within the next 3 weeks at the clinic. Call Dr. Russella DarBenitez tomorrow schedule next available appointment. Tell the office staff that you were seen here when scheduling the appointment. Take the penicillin prescribed to you as directed. Take Tylenol as directed for toothache

## 2016-01-11 NOTE — ED Notes (Signed)
Pt c/o high BP and being lightheaded when she woke up this morning.
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
## 2016-01-11 NOTE — ED Notes (Signed)
After triage pt c/o chest pain.

## 2016-01-12 LAB — CBG MONITORING, ED: Glucose-Capillary: 260 mg/dL — ABNORMAL HIGH (ref 65–99)

## 2016-03-26 ENCOUNTER — Ambulatory Visit (HOSPITAL_COMMUNITY): Admission: EM | Admit: 2016-03-26 | Discharge: 2016-03-26 | Discharge status: Absent without leave | Payer: Self-pay

## 2016-09-04 ENCOUNTER — Ambulatory Visit (HOSPITAL_COMMUNITY)
Admission: EM | Admit: 2016-09-04 | Discharge: 2016-09-04 | Disposition: A | Discharge status: Home / Self Care | Payer: Worker's Compensation | Attending: Emergency Medicine | Admitting: Emergency Medicine

## 2016-09-04 ENCOUNTER — Encounter (HOSPITAL_COMMUNITY): Payer: Self-pay | Admitting: *Deleted

## 2016-09-04 DIAGNOSIS — M542 Cervicalgia: Secondary | ICD-10-CM | POA: Diagnosis not present

## 2016-09-04 DIAGNOSIS — M79672 Pain in left foot: Secondary | ICD-10-CM

## 2016-09-04 DIAGNOSIS — G8929 Other chronic pain: Secondary | ICD-10-CM

## 2016-09-04 MED ORDER — KETOROLAC TROMETHAMINE 60 MG/2ML IM SOLN
60.0000 mg | Freq: Once | INTRAMUSCULAR | Status: AC
  Administered 2016-09-04: 60 mg via INTRAMUSCULAR

## 2016-09-04 MED ORDER — KETOROLAC TROMETHAMINE 60 MG/2ML IM SOLN
INTRAMUSCULAR | Status: AC
  Filled 2016-09-04: qty 2

## 2016-09-04 NOTE — Discharge Instructions (Signed)
You were given a shot of Toradol today to help with pain. Recommend apply warm compresses to area for comfort. Follow-up with your worker's comp representative for referral to Orthopedic for further evaluation of continued foot pain.
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
## 2017-03-10 ENCOUNTER — Ambulatory Visit (HOSPITAL_COMMUNITY)
Admission: EM | Admit: 2017-03-10 | Discharge: 2017-03-10 | Disposition: A | Discharge status: Home / Self Care | Payer: Self-pay | Attending: Internal Medicine | Admitting: Internal Medicine

## 2017-03-10 ENCOUNTER — Encounter (HOSPITAL_COMMUNITY): Payer: Self-pay | Admitting: Emergency Medicine

## 2017-03-10 DIAGNOSIS — K5901 Slow transit constipation: Secondary | ICD-10-CM

## 2017-03-10 DIAGNOSIS — R42 Dizziness and giddiness: Secondary | ICD-10-CM

## 2017-03-10 HISTORY — DX: Type 2 diabetes mellitus without complications: E11.9

## 2017-03-10 MED ORDER — MECLIZINE HCL 25 MG PO TABS: ORAL_TABLET | ORAL | 0 refills | Status: DC

## 2017-03-10 NOTE — ED Triage Notes (Signed)
Pt. Stated, I've been getting dizzy for the last few days , and I've not use the bathroom in a week. Tried murilax, not working.
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
## 2017-03-10 NOTE — Discharge Instructions (Signed)
Your dizziness is likely due to an inner ear disorder. These generally get better on their old. Sometimes it will last anywhere from a few hours to a couple of weeks. It is generally elicited or worse when changing positions. Take the Antivert for dizziness as directed and as needed. Move slowly. Recommended not driving if it causes dizziness. For constipation use the MiraLAX with 3 glasses of liquid with a Full in each one and appeared in an hour and a half. If no good results in 6-8 hours may repeat as needed. May also take atypical locks prior to the MiraLAX.

## 2017-12-12 ENCOUNTER — Ambulatory Visit (HOSPITAL_COMMUNITY)
Admission: EM | Admit: 2017-12-12 | Discharge: 2017-12-12 | Disposition: A | Discharge status: Home / Self Care | Payer: Worker's Compensation

## 2017-12-12 ENCOUNTER — Encounter (HOSPITAL_COMMUNITY): Payer: Self-pay | Admitting: Emergency Medicine

## 2017-12-12 ENCOUNTER — Other Ambulatory Visit: Payer: Self-pay

## 2017-12-12 DIAGNOSIS — M79602 Pain in left arm: Secondary | ICD-10-CM

## 2017-12-12 DIAGNOSIS — M542 Cervicalgia: Secondary | ICD-10-CM | POA: Diagnosis not present

## 2017-12-12 NOTE — Discharge Instructions (Addendum)
You may take 500mg  Tylenol every 6 hours for pain and inflammation. Please set up an appointment with Occupational Health for continued evaluation of your neck and arm pain.
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
## 2017-12-12 NOTE — ED Triage Notes (Signed)
Pt states she hurt her arm, she was picking up a pan at work and she strained her L arm. Pt c/o L arm soreness. Full ROM.

## 2020-06-27 DIAGNOSIS — J0140 Acute pansinusitis, unspecified: Secondary | ICD-10-CM | POA: Insufficient documentation

## 2020-07-11 ENCOUNTER — Emergency Department (HOSPITAL_BASED_OUTPATIENT_CLINIC_OR_DEPARTMENT_OTHER)
Admission: EM | Admit: 2020-07-11 | Discharge: 2020-07-11 | Disposition: A | Discharge status: Home / Self Care | Payer: Self-pay | Attending: Emergency Medicine | Admitting: Emergency Medicine

## 2020-07-11 ENCOUNTER — Other Ambulatory Visit: Payer: Self-pay

## 2020-07-11 ENCOUNTER — Encounter (HOSPITAL_BASED_OUTPATIENT_CLINIC_OR_DEPARTMENT_OTHER): Payer: Self-pay | Admitting: Emergency Medicine

## 2020-07-11 DIAGNOSIS — Y9289 Other specified places as the place of occurrence of the external cause: Secondary | ICD-10-CM | POA: Insufficient documentation

## 2020-07-11 DIAGNOSIS — N644 Mastodynia: Secondary | ICD-10-CM | POA: Insufficient documentation

## 2020-07-11 DIAGNOSIS — Y939 Activity, unspecified: Secondary | ICD-10-CM | POA: Insufficient documentation

## 2020-07-11 DIAGNOSIS — Y999 Unspecified external cause status: Secondary | ICD-10-CM | POA: Insufficient documentation

## 2020-07-11 DIAGNOSIS — Z5321 Procedure and treatment not carried out due to patient leaving prior to being seen by health care provider: Secondary | ICD-10-CM | POA: Insufficient documentation

## 2020-07-11 NOTE — ED Notes (Signed)
Called pt, no answer.
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
## 2020-10-24 ENCOUNTER — Encounter: Payer: Self-pay | Admitting: Emergency Medicine

## 2020-10-24 ENCOUNTER — Ambulatory Visit
Admission: EM | Admit: 2020-10-24 | Discharge: 2020-10-24 | Disposition: A | Discharge status: Home / Self Care | Payer: Self-pay | Attending: Emergency Medicine | Admitting: Emergency Medicine

## 2020-10-24 ENCOUNTER — Other Ambulatory Visit: Payer: Self-pay

## 2020-10-24 DIAGNOSIS — I10 Essential (primary) hypertension: Secondary | ICD-10-CM

## 2020-10-24 DIAGNOSIS — M2550 Pain in unspecified joint: Secondary | ICD-10-CM

## 2020-10-24 MED ORDER — HYDROCHLOROTHIAZIDE 25 MG PO TABS: 25.0000 mg | ORAL_TABLET | Freq: Every day | ORAL | 0 refills | Status: DC

## 2020-10-24 MED ORDER — NAPROXEN 500 MG PO TABS: 500.0000 mg | ORAL_TABLET | Freq: Two times a day (BID) | ORAL | 0 refills | Status: DC

## 2020-10-24 MED ORDER — MECLIZINE HCL 25 MG PO TABS: 25.0000 mg | ORAL_TABLET | Freq: Three times a day (TID) | ORAL | 0 refills | Status: DC | PRN

## 2020-10-24 NOTE — ED Triage Notes (Signed)
Pt sts bilateral arm/hand and leg/foot pain x sevreal days

## 2020-10-24 NOTE — Discharge Instructions (Signed)
Naprosyn twice daily for pain Hydrochlorothiazide for blood pressure Follow up with primary care
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
## 2021-02-17 DIAGNOSIS — H9201 Otalgia, right ear: Secondary | ICD-10-CM | POA: Insufficient documentation

## 2021-04-06 ENCOUNTER — Ambulatory Visit
Admission: EM | Admit: 2021-04-06 | Discharge: 2021-04-06 | Disposition: A | Discharge status: Other Acute Inpatient Hospital | Payer: Medicaid Other

## 2021-04-06 ENCOUNTER — Emergency Department (HOSPITAL_COMMUNITY): Payer: Medicaid Other

## 2021-04-06 ENCOUNTER — Encounter: Payer: Self-pay | Admitting: Emergency Medicine

## 2021-04-06 ENCOUNTER — Other Ambulatory Visit: Payer: Self-pay

## 2021-04-06 ENCOUNTER — Encounter (HOSPITAL_COMMUNITY): Payer: Self-pay

## 2021-04-06 ENCOUNTER — Observation Stay (HOSPITAL_COMMUNITY)
Admission: EM | Admit: 2021-04-06 | Discharge: 2021-04-07 | Disposition: A | Discharge status: Home / Self Care | Payer: Self-pay | Attending: Internal Medicine | Admitting: Internal Medicine

## 2021-04-06 DIAGNOSIS — Z87891 Personal history of nicotine dependence: Secondary | ICD-10-CM | POA: Insufficient documentation

## 2021-04-06 DIAGNOSIS — E1159 Type 2 diabetes mellitus with other circulatory complications: Secondary | ICD-10-CM

## 2021-04-06 DIAGNOSIS — Z79899 Other long term (current) drug therapy: Secondary | ICD-10-CM | POA: Insufficient documentation

## 2021-04-06 DIAGNOSIS — IMO0002 Reserved for concepts with insufficient information to code with codable children: Secondary | ICD-10-CM

## 2021-04-06 DIAGNOSIS — E1165 Type 2 diabetes mellitus with hyperglycemia: Secondary | ICD-10-CM

## 2021-04-06 DIAGNOSIS — E119 Type 2 diabetes mellitus without complications: Secondary | ICD-10-CM | POA: Insufficient documentation

## 2021-04-06 DIAGNOSIS — I10 Essential (primary) hypertension: Secondary | ICD-10-CM | POA: Insufficient documentation

## 2021-04-06 DIAGNOSIS — G459 Transient cerebral ischemic attack, unspecified: Principal | ICD-10-CM | POA: Insufficient documentation

## 2021-04-06 DIAGNOSIS — R202 Paresthesia of skin: Secondary | ICD-10-CM

## 2021-04-06 DIAGNOSIS — Z20822 Contact with and (suspected) exposure to covid-19: Secondary | ICD-10-CM | POA: Insufficient documentation

## 2021-04-06 DIAGNOSIS — I152 Hypertension secondary to endocrine disorders: Secondary | ICD-10-CM

## 2021-04-06 DIAGNOSIS — Z7982 Long term (current) use of aspirin: Secondary | ICD-10-CM | POA: Insufficient documentation

## 2021-04-06 LAB — COMPREHENSIVE METABOLIC PANEL
ALT: 21 U/L (ref 0–44)
AST: 15 U/L (ref 15–41)
Albumin: 3.2 g/dL — ABNORMAL LOW (ref 3.5–5.0)
Alkaline Phosphatase: 114 U/L (ref 38–126)
Anion gap: 9 (ref 5–15)
BUN: 12 mg/dL (ref 6–20)
CO2: 26 mmol/L (ref 22–32)
Calcium: 8.6 mg/dL — ABNORMAL LOW (ref 8.9–10.3)
Chloride: 100 mmol/L (ref 98–111)
Creatinine, Ser: 0.75 mg/dL (ref 0.44–1.00)
GFR, Estimated: >60 mL/min (ref >60)
Glucose, Bld: 408 mg/dL — ABNORMAL HIGH (ref 70–99)
Potassium: 4.3 mmol/L (ref 3.5–5.1)
Sodium: 135 mmol/L (ref 135–145)
Total Bilirubin: 0.9 mg/dL (ref 0.3–1.2)
Total Protein: 6.8 g/dL (ref 6.5–8.1)

## 2021-04-06 LAB — DIFFERENTIAL
Abs Immature Granulocytes: 0.03 10*3/uL (ref 0.00–0.07)
Basophils Absolute: 0 10*3/uL (ref 0.0–0.1)
Basophils Relative: 0 %
Eosinophils Absolute: 0 10*3/uL (ref 0.0–0.5)
Eosinophils Relative: 0 %
Immature Granulocytes: 0 %
Lymphocytes Relative: 22 %
Lymphs Abs: 1.8 10*3/uL (ref 0.7–4.0)
Monocytes Absolute: 0.6 10*3/uL (ref 0.1–1.0)
Monocytes Relative: 7 %
Neutro Abs: 5.7 10*3/uL (ref 1.7–7.7)
Neutrophils Relative %: 71 %

## 2021-04-06 LAB — RESP PANEL BY RT-PCR (FLU A&B, COVID) ARPGX2
Influenza A by PCR: NEGATIVE
Influenza B by PCR: NEGATIVE
SARS Coronavirus 2 by RT PCR: NEGATIVE

## 2021-04-06 LAB — CBC
HCT: 44.5 % (ref 36.0–46.0)
Hemoglobin: 13.2 g/dL (ref 12.0–15.0)
MCH: 22.6 pg — ABNORMAL LOW (ref 26.0–34.0)
MCHC: 29.7 g/dL — ABNORMAL LOW (ref 30.0–36.0)
MCV: 76.3 fL — ABNORMAL LOW (ref 80.0–100.0)
Platelets: 288 10*3/uL (ref 150–400)
RBC: 5.83 MIL/uL — ABNORMAL HIGH (ref 3.87–5.11)
RDW: 14.1 % (ref 11.5–15.5)
WBC: 8.1 10*3/uL (ref 4.0–10.5)
nRBC: 0 % (ref 0.0–0.2)

## 2021-04-06 LAB — I-STAT BETA HCG BLOOD, ED (MC, WL, AP ONLY): I-stat hCG, quantitative: 6.5 m[IU]/mL — ABNORMAL HIGH (ref <5)

## 2021-04-06 LAB — I-STAT CHEM 8, ED
BUN: 14 mg/dL (ref 6–20)
Calcium, Ion: 1.12 mmol/L — ABNORMAL LOW (ref 1.15–1.40)
Chloride: 100 mmol/L (ref 98–111)
Creatinine, Ser: 0.7 mg/dL (ref 0.44–1.00)
Glucose, Bld: 403 mg/dL — ABNORMAL HIGH (ref 70–99)
HCT: 46 % (ref 36.0–46.0)
Hemoglobin: 15.6 g/dL — ABNORMAL HIGH (ref 12.0–15.0)
Potassium: 4.6 mmol/L (ref 3.5–5.1)
Sodium: 138 mmol/L (ref 135–145)
TCO2: 28 mmol/L (ref 22–32)

## 2021-04-06 LAB — RAPID URINE DRUG SCREEN, HOSP PERFORMED
Amphetamines: NOT DETECTED
Barbiturates: NOT DETECTED
Benzodiazepines: NOT DETECTED
Cocaine: NOT DETECTED
Opiates: NOT DETECTED
Tetrahydrocannabinol: NOT DETECTED

## 2021-04-06 LAB — URINALYSIS, ROUTINE W REFLEX MICROSCOPIC
Bilirubin Urine: NEGATIVE
Glucose, UA: >=500 mg/dL — AB
Hgb urine dipstick: NEGATIVE
Ketones, ur: 20 mg/dL — AB
Leukocytes,Ua: NEGATIVE
Nitrite: NEGATIVE
Protein, ur: NEGATIVE mg/dL
Specific Gravity, Urine: 1.033 — ABNORMAL HIGH (ref 1.005–1.030)
pH: 7 (ref 5.0–8.0)

## 2021-04-06 LAB — ETHANOL: Alcohol, Ethyl (B): <10 mg/dL (ref <10)

## 2021-04-06 LAB — PROTIME-INR
INR: 0.9 (ref 0.8–1.2)
Prothrombin Time: 11.9 seconds (ref 11.4–15.2)

## 2021-04-06 LAB — APTT: aPTT: 24 seconds (ref 24–36)

## 2021-04-06 LAB — LDL CHOLESTEROL, DIRECT: Direct LDL: 227.4 mg/dL — ABNORMAL HIGH (ref 0–99)

## 2021-04-06 IMAGING — MR MR HEAD W/O CM
6 series · 48 of 48 positions shown · non-contrast
Comparison: None.

CLINICAL DATA: Neuro deficit, acute stroke suspected.

EXAM:
MRI HEAD WITHOUT CONTRAST
TECHNIQUE: Multiplanar, multiecho pulse sequences of the brain and surrounding
structures were obtained without intravenous contrast.

[Series 5: DWI · axial · 3.0mm · 0.88mm/px · z∈[-82,+54]mm · 16 of 96 slices shown (1 of 4)]
[im 1/96]
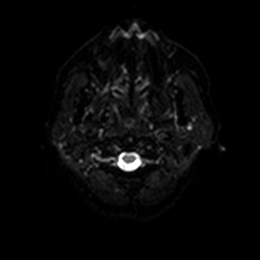
[im 7/96]
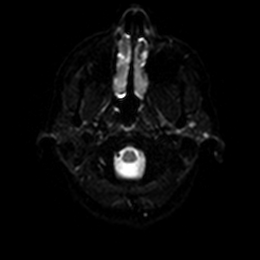
[im 13/96]
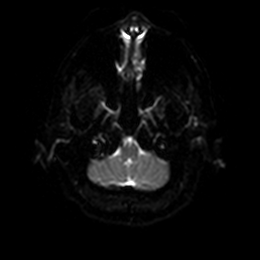
[im 20/96]
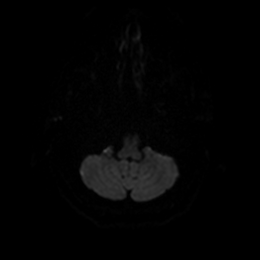
[im 26/96]
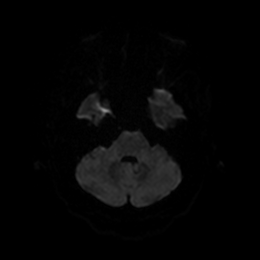
[im 32/96]
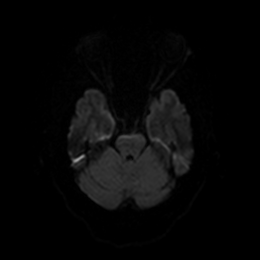
[im 39/96]
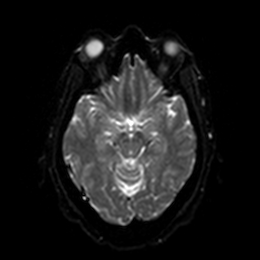
[im 45/96]
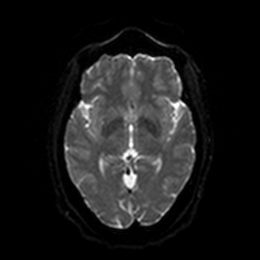
[im 51/96]
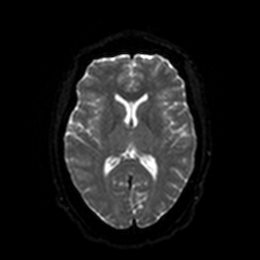
[im 58/96]
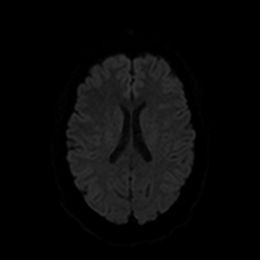
[im 64/96]
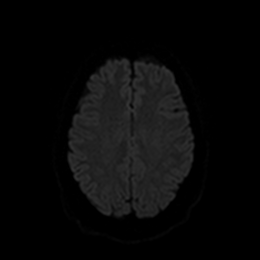
[im 70/96]
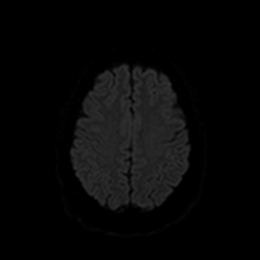
[im 77/96]
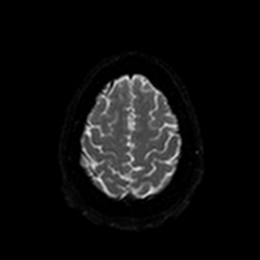
[im 83/96]
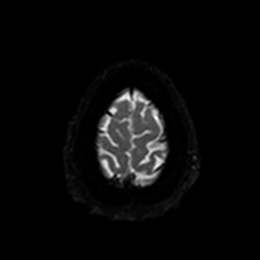
[im 89/96]
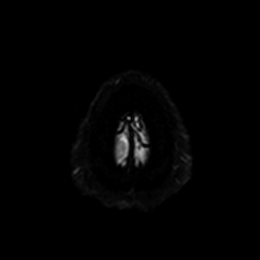
[im 96/96]
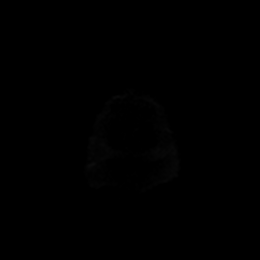

[Series 6: DWI · axial · 3.0mm · 0.88mm/px · z∈[-82,+54]mm · 8 of 48 slices shown (2 of 4)]
[im 1/48]
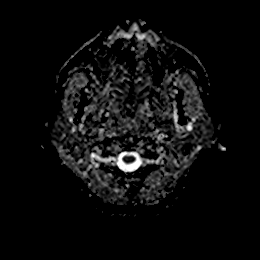
[im 7/48]
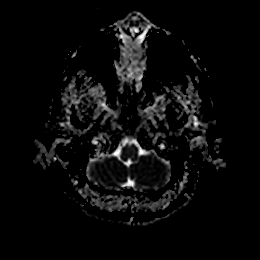
[im 14/48]
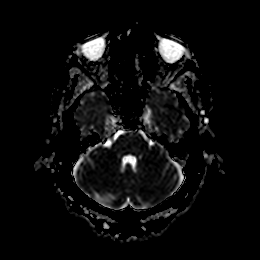
[im 21/48]
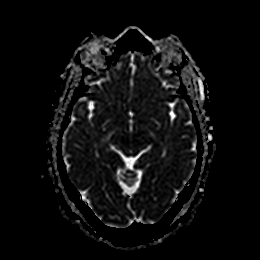
[im 27/48]
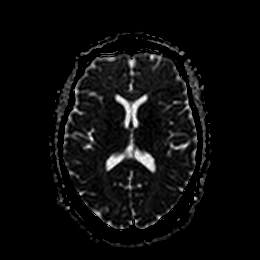
[im 34/48]
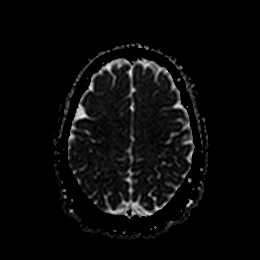
[im 41/48]
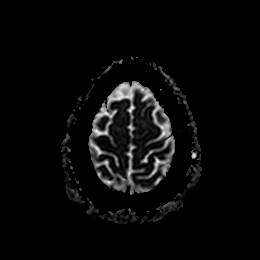
[im 48/48]
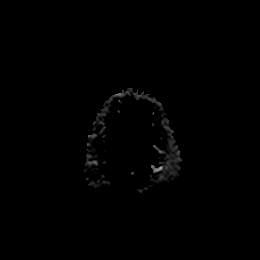

[Series 7: DWI · coronal · 4.0mm · 0.88mm/px · 11 of 66 slices shown (3 of 4)]
[im 1/66]
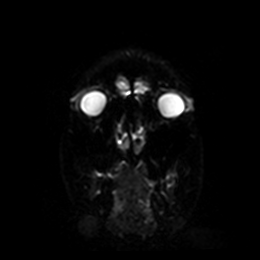
[im 7/66]
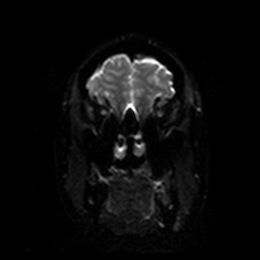
[im 14/66]
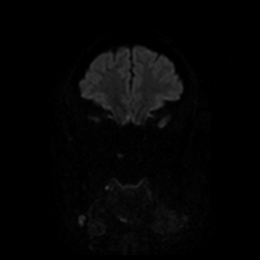
[im 20/66]
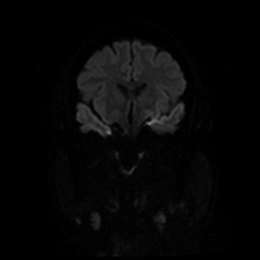
[im 27/66]
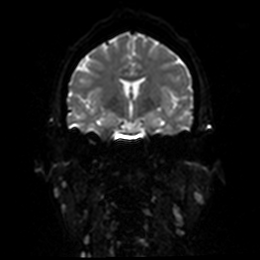
[im 33/66]
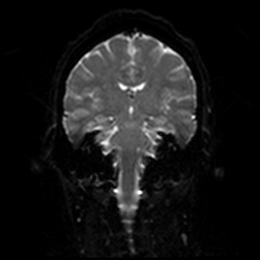
[im 40/66]
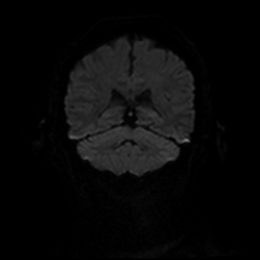
[im 46/66]
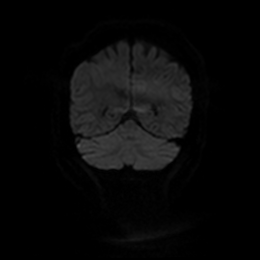
[im 53/66]
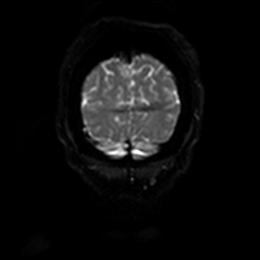
[im 59/66]
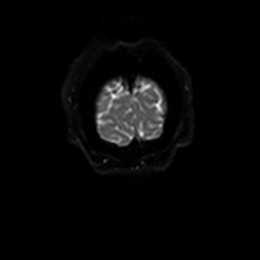
[im 66/66]
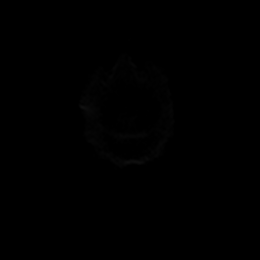

[Series 8: DWI · coronal · 4.0mm · 0.88mm/px · 5 of 33 slices shown (4 of 4)]
[im 1/33]
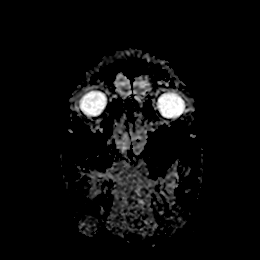
[im 9/33]
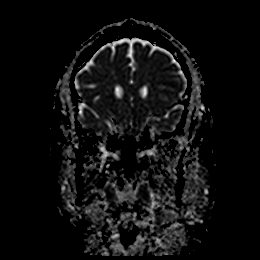
[im 17/33]
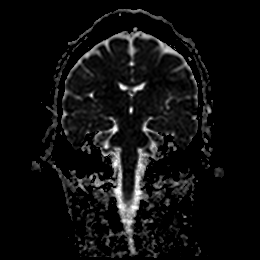
[im 25/33]
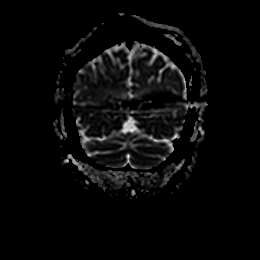
[im 33/33]
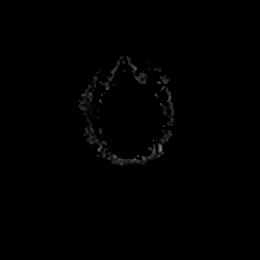

[Series 9: T2 · axial · 5.0mm · 0.72mm/px · z∈[-84,+55]mm · 4 of 25 slices shown]
[im 1/25]
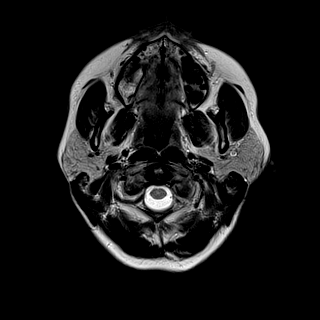
[im 9/25]
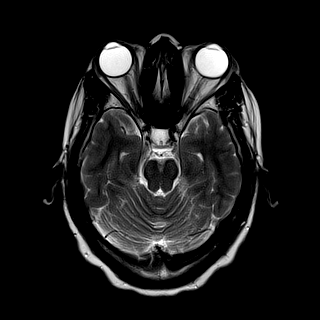
[im 17/25]
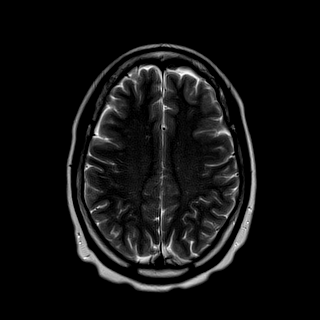
[im 25/25]
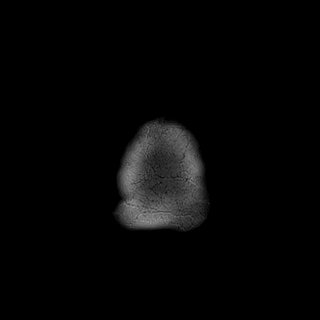

[Series 10: FLAIR · axial · 5.0mm · 0.45mm/px · z∈[-84,+55]mm · 4 of 25 slices shown]
[im 1/25]
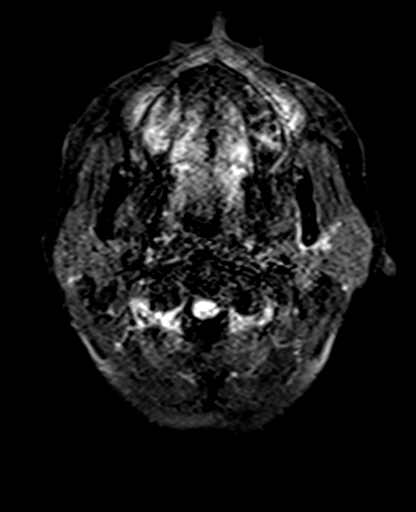
[im 9/25]
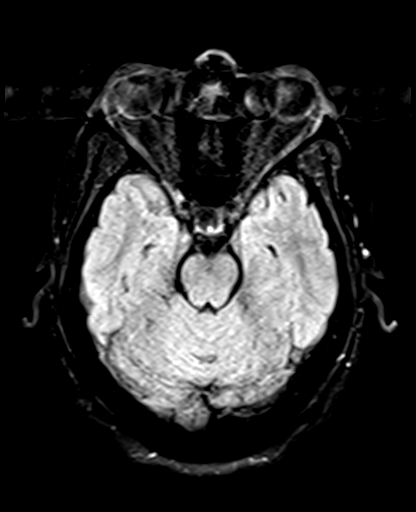
[im 17/25]
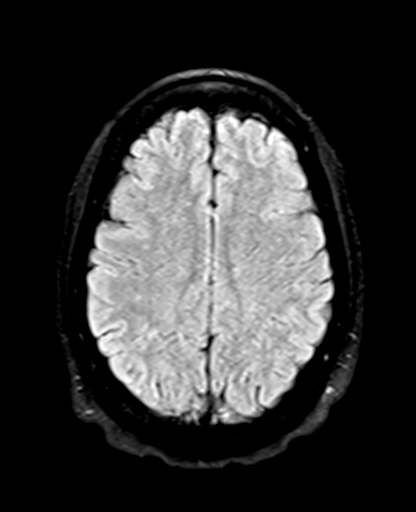
[im 25/25]
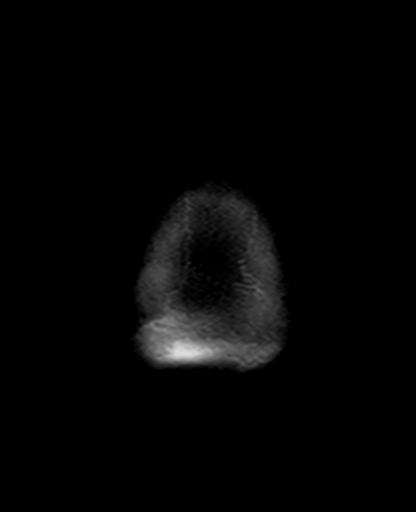

[48 of 48 positions shown; findings below may reference images not displayed]

FINDINGS: Incomplete exam due to patient tolerance. Diffusion imaging (DWI and
ADC) and axial T2/FLAIR with obtained. Remaining sequences were not
performed. Within this limitation:

Brain: No evidence of acute infarction, acute hemorrhage,
hydrocephalus, extra-axial collection or mass lesion.

Vascular: Major arterial flow voids are maintained at the skull
base. Small left vertebral artery, most likely non dominant.

Skull and upper cervical spine: Normal marrow signal.

Sinuses/Orbits: Clear sinuses.  Unremarkable orbits.

Other: No sizable mastoid effusions.
IMPRESSION: Incomplete exam (as detailed above) without acute infarct.

## 2021-04-06 IMAGING — CT CT HEAD W/O CM
4 series · 17 of 47 positions shown, 19 images · non-contrast
Comparison: None.

CLINICAL DATA: Left-sided numbness and tingling since this morning.

EXAM:
CT HEAD WITHOUT CONTRAST
TECHNIQUE: Contiguous axial images were obtained from the base of the skull
through the vertex without intravenous contrast.

[Series 3: head wo · axial · 0.42mm/px · z∈[+1139,+1259]mm · 6 of 34 slices shown, 8 images]
[im 5/34  brain]
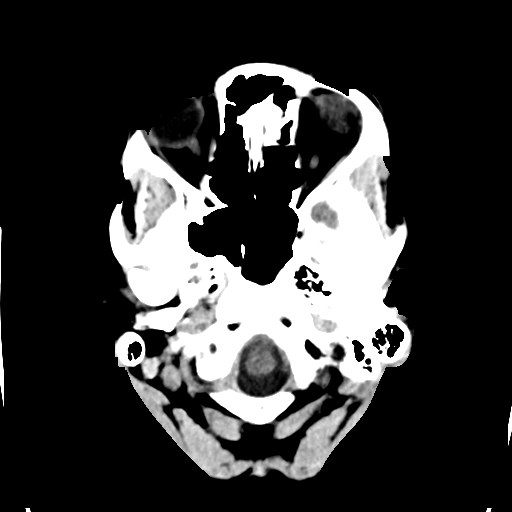
[im 5/34  bone]
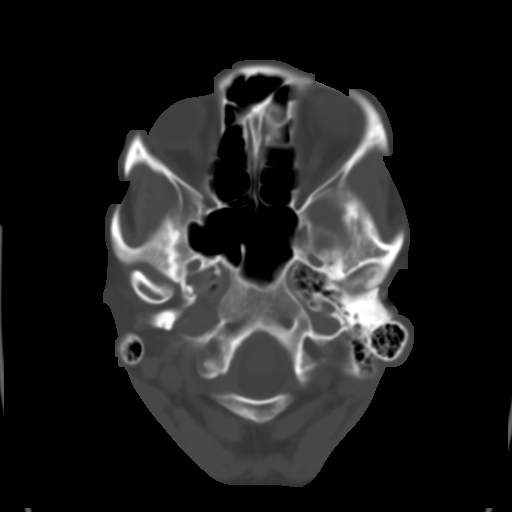
[im 10/34  brain]
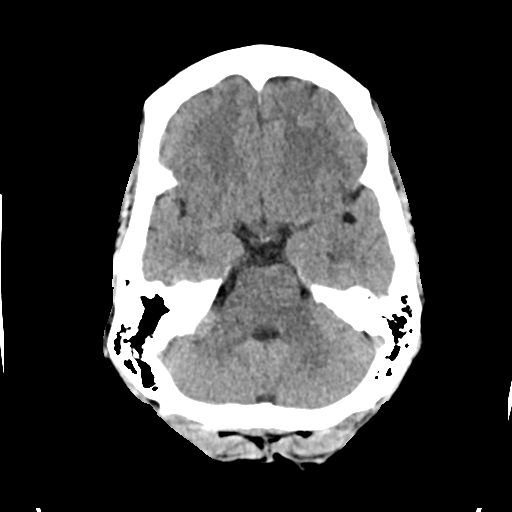
[im 15/34  brain]
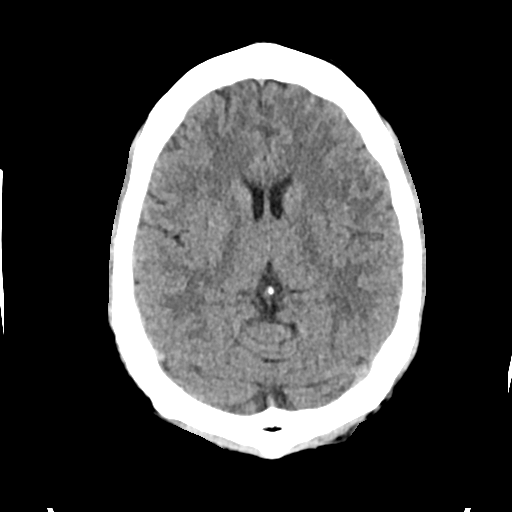
[im 19/34  brain]
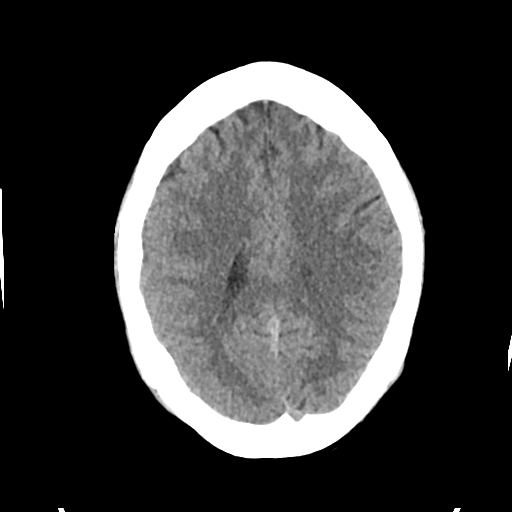
[im 24/34  brain]
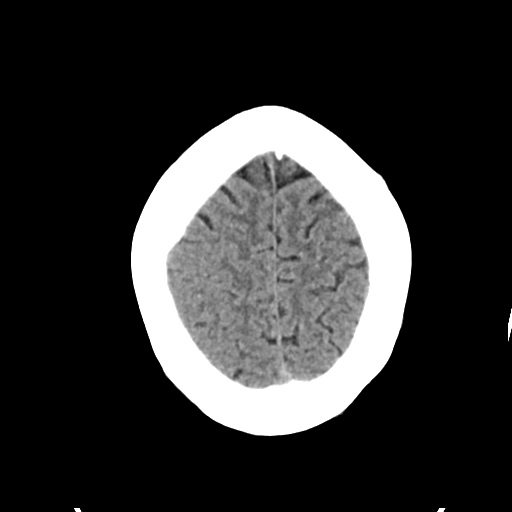
[im 24/34  bone]
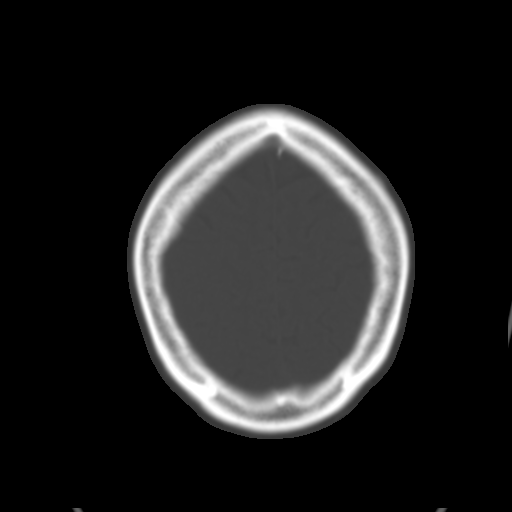
[im 29/34  brain]
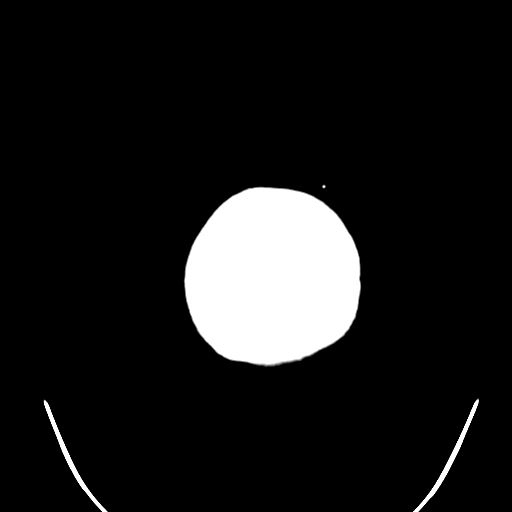

[Series 4: head bone · axial · 0.37mm/px · z∈[+1145,+1224]mm · 5 of 86 slices shown]
[im 9/86  bone]
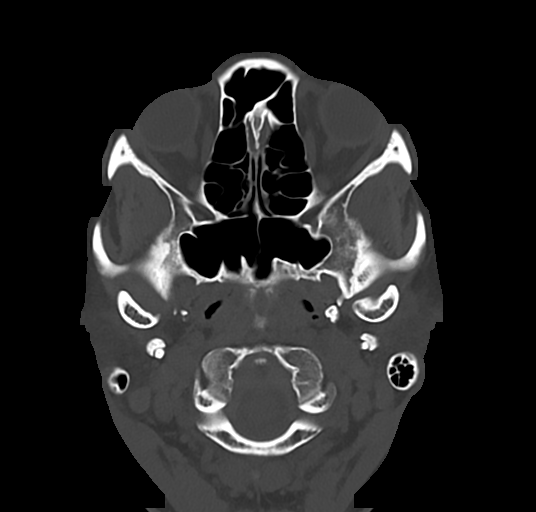
[im 17/86  bone]
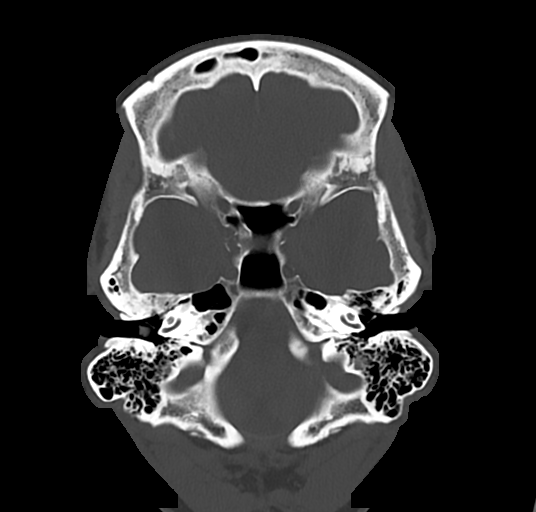
[im 29/86  bone]
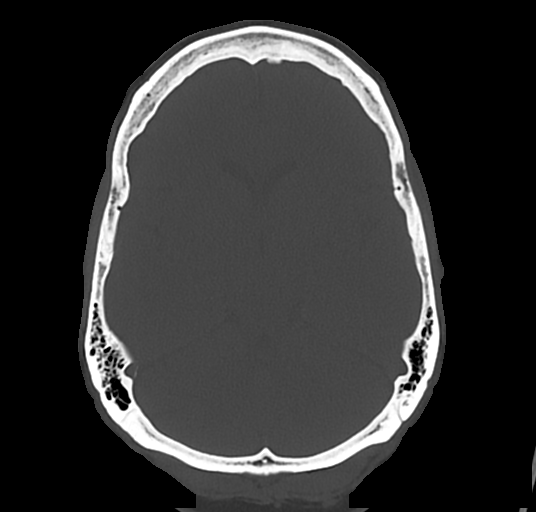
[im 37/86  bone]
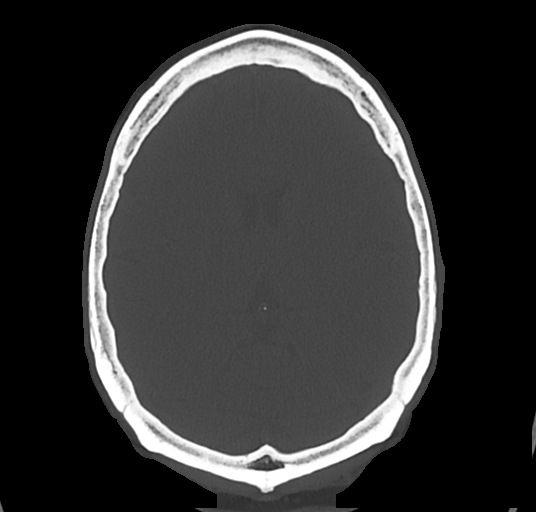
[im 49/86  bone]
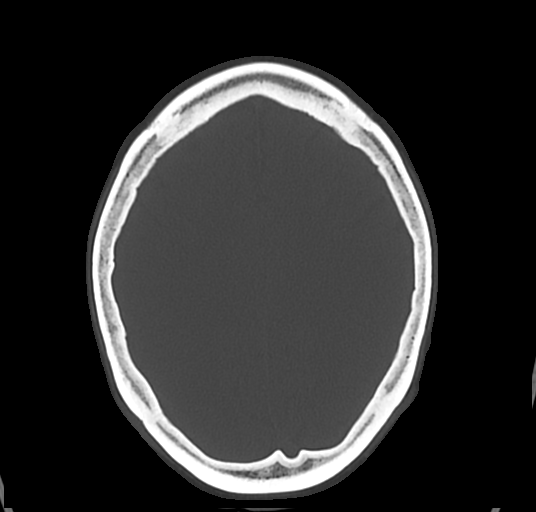

[Series 5: cor soft · coronal · 0.33mm/px · 3 of 65 slices shown]
[im 22/65  brain]
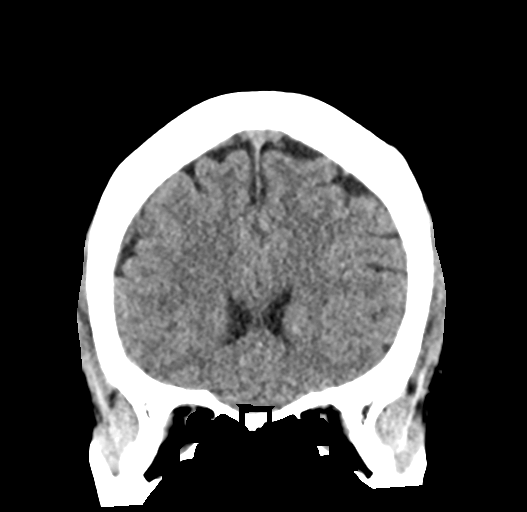
[im 29/65  brain]
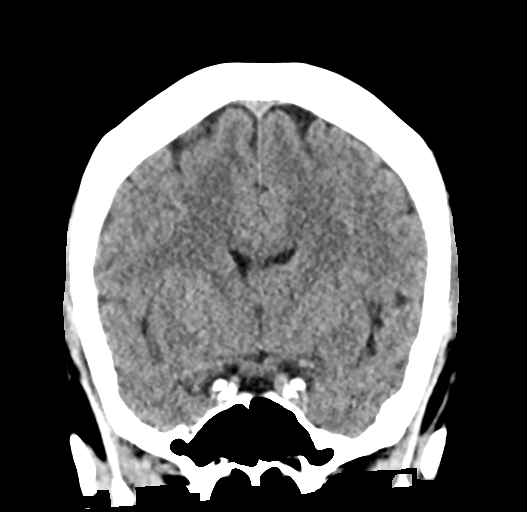
[im 36/65  brain]
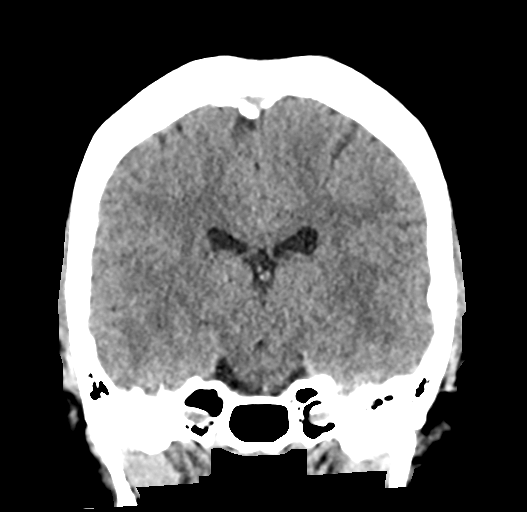

[Series 6: sag soft · sagittal · 0.34mm/px · 3 of 53 slices shown]
[im 18/53  brain]
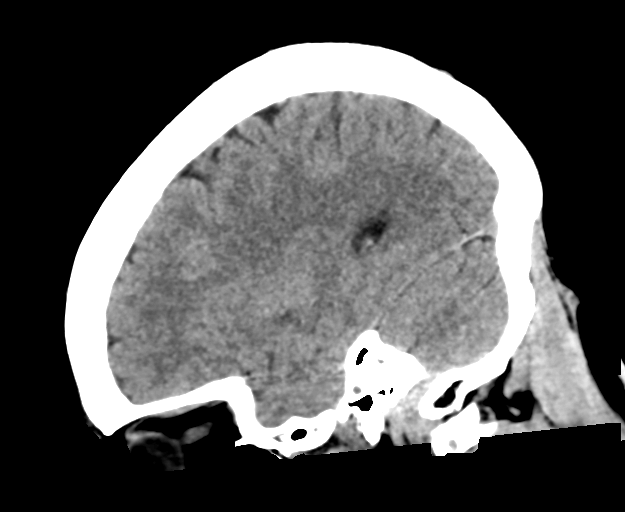
[im 27/53  brain]
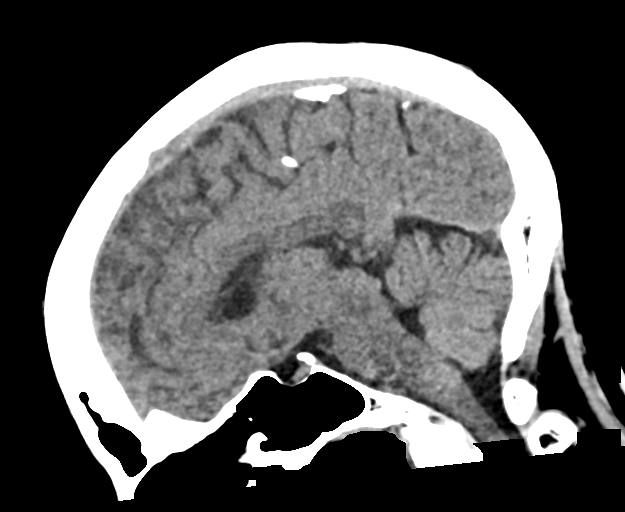
[im 35/53  brain]
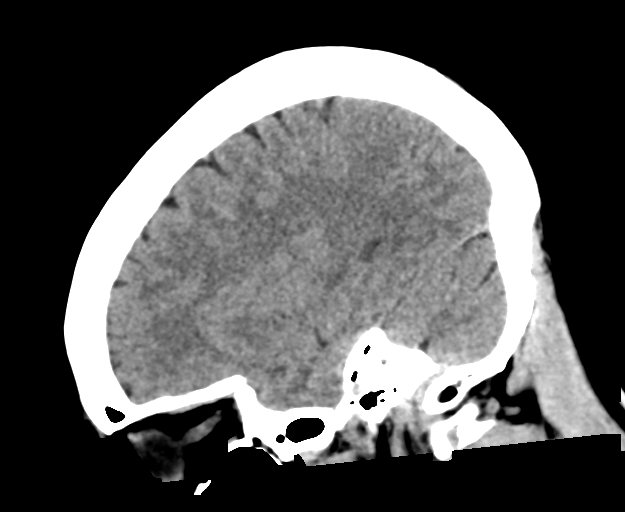

[17 of 47 positions shown; findings below may reference images not displayed]

FINDINGS: Brain: No evidence of acute infarction, hemorrhage, hydrocephalus,
extra-axial collection or mass lesion/mass effect.

Vascular: Atherosclerotic vascular calcification of the carotid
siphons. No hyperdense vessel.

Skull: Normal. Negative for fracture or focal lesion.

Sinuses/Orbits: No acute finding.

Other: None.
IMPRESSION: 1. Normal noncontrast head CT.

## 2021-04-06 MED ORDER — LORAZEPAM 2 MG/ML IJ SOLN: 1.0000 mg | Freq: Once | INTRAMUSCULAR | Status: DC

## 2021-04-06 MED ORDER — ACETAMINOPHEN 160 MG/5ML PO SOLN: 650.0000 mg | ORAL | Status: DC | PRN

## 2021-04-06 MED ORDER — EMPAGLIFLOZIN 10 MG PO TABS
10.0000 mg | ORAL_TABLET | Freq: Every day | ORAL | Status: DC
  Administered 2021-04-07: 10 mg via ORAL
  Filled 2021-04-06 (×2): qty 1

## 2021-04-06 MED ORDER — ASPIRIN 81 MG PO CHEW
324.0000 mg | CHEWABLE_TABLET | Freq: Once | ORAL | Status: AC
  Administered 2021-04-06: 324 mg via ORAL
  Filled 2021-04-06: qty 4

## 2021-04-06 MED ORDER — INSULIN ASPART 100 UNIT/ML IJ SOLN: 0.0000 [IU] | Freq: Every day | INTRAMUSCULAR | Status: DC

## 2021-04-06 MED ORDER — INSULIN ASPART 100 UNIT/ML IJ SOLN: 0.0000 [IU] | Freq: Three times a day (TID) | INTRAMUSCULAR | Status: DC

## 2021-04-06 MED ORDER — ACETAMINOPHEN 650 MG RE SUPP: 650.0000 mg | RECTAL | Status: DC | PRN

## 2021-04-06 MED ORDER — STROKE: EARLY STAGES OF RECOVERY BOOK: Freq: Once | Status: AC

## 2021-04-06 MED ORDER — ACETAMINOPHEN 325 MG PO TABS: 650.0000 mg | ORAL_TABLET | ORAL | Status: DC | PRN

## 2021-04-06 NOTE — ED Triage Notes (Signed)
Pt reports left sided numbness ,pain and tingling to left side when waking up at 10am. Pt states similar s/s on Saturday with no obvious neuro deficits noted.

## 2021-04-06 NOTE — ED Notes (Signed)
Pt did not answer.

## 2021-04-06 NOTE — Progress Notes (Signed)
Pt refused to come to MRI when transport was about to bring her for her exam. Attempted to notify RN

## 2021-04-06 NOTE — ED Notes (Signed)
Pt to go to ED for further eval per [facility]; pt agreed and family here to take her

## 2021-04-06 NOTE — ED Provider Notes (Signed)
Emergency Medicine Provider Triage Evaluation Note  Kathleen Mcmillan , a 55 y.o. female  was evaluated in triage.  Pt complains of numbness, paresthesia and tingling on the left side of the face and arm.  She has had this intermittently in the past.  Today she has it in her left leg.  She awoke with symptoms at 10 AM.  Review of Systems  Positive: numbness Negative:         Facial droop  Physical Exam  BP (!) 213/117 (BP Location: Left Arm)   Pulse (!) 113   Temp 98.1 F (36.7 C) (Oral)   Resp 18   SpO2 98%  Gen:   Awake, no distress   Resp:  Normal effort  MSK:   Moves extremities without difficulty  Other:    Medical Decision Making  Medically screening exam initiated at 2:14 PM.  Appropriate orders placed.  Kathleen Mcmillan was informed that the remainder of the evaluation will be completed by another provider, this initial triage assessment does not replace that evaluation, and the importance of remaining in the ED until their evaluation is complete.  Patient seen initially for consult as code stroke with Dr. Selina Cooley however because she woke with symptoms will work-up of stroke.  Not need to be worked up as a code.  Orders placed.  Henderly hypertensive   Arthor Captain, PA-C 04/06/21 1422    Arby Barrette, MD 04/14/21 2153
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
## 2021-04-06 NOTE — ED Triage Notes (Signed)
Pt here for left sided numbness, pain and tingling to left side upon waking this am; pt sts similar event on Saturday; no obvious neuro deficits noted

## 2021-04-06 NOTE — ED Notes (Signed)
This RN went in to discuss with pt the MRI. Pt reports that she is not claustrophobic but she has had surgery in her neck and when she lays down she has to burp a lot and cannot get the burp up when she is laying flat and feels like she cannot breathe. Dr. Selina Cooley and Dr. Wynona Neat notified
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
## 2021-04-06 NOTE — ED Notes (Signed)
Pt taken to MRI  

## 2021-04-06 NOTE — ED Notes (Signed)
Dr. Wynona Neat at bedside
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
## 2021-04-07 ENCOUNTER — Observation Stay (HOSPITAL_BASED_OUTPATIENT_CLINIC_OR_DEPARTMENT_OTHER): Payer: Medicaid Other

## 2021-04-07 ENCOUNTER — Observation Stay (HOSPITAL_COMMUNITY): Payer: Medicaid Other

## 2021-04-07 ENCOUNTER — Other Ambulatory Visit (HOSPITAL_COMMUNITY): Payer: Self-pay

## 2021-04-07 DIAGNOSIS — G459 Transient cerebral ischemic attack, unspecified: Secondary | ICD-10-CM

## 2021-04-07 LAB — LIPID PANEL
Cholesterol: 257 mg/dL — ABNORMAL HIGH (ref 0–200)
HDL: 49 mg/dL (ref >40)
LDL Cholesterol: 179 mg/dL — ABNORMAL HIGH (ref 0–99)
Total CHOL/HDL Ratio: 5.2 RATIO
Triglycerides: 145 mg/dL (ref <150)
VLDL: 29 mg/dL (ref 0–40)

## 2021-04-07 LAB — CBG MONITORING, ED
Glucose-Capillary: 247 mg/dL — ABNORMAL HIGH (ref 70–99)
Glucose-Capillary: 357 mg/dL — ABNORMAL HIGH (ref 70–99)
Glucose-Capillary: 348 mg/dL — ABNORMAL HIGH (ref 70–99)
Glucose-Capillary: 261 mg/dL — ABNORMAL HIGH (ref 70–99)

## 2021-04-07 LAB — ECHOCARDIOGRAM COMPLETE BUBBLE STUDY
Area-P 1/2: 4.1 cm2
S' Lateral: 2.6 cm

## 2021-04-07 LAB — HIV ANTIBODY (ROUTINE TESTING W REFLEX): HIV Screen 4th Generation wRfx: NONREACTIVE

## 2021-04-07 IMAGING — MR MR MRA HEAD W/O CM
1 series · 18 of 48 positions shown · IV contrast (gadavist)
Comparison: Prior MRI from [DATE].

CLINICAL DATA: Initial evaluation for acute left-sided numbness.

EXAM:
MRA HEAD WITHOUT CONTRAST
MRA NECK WITHOUT AND WITH CONTRAST
TECHNIQUE: Angiographic images of the Circle of Willis were acquired using MRA
technique without intravenous contrast. Angiographic images of the
neck were acquired using MRA technique without and with intravenous
contrast. Carotid stenosis measurements (when applicable) are
obtained utilizing NASCET criteria, using the distal internal
carotid diameter as the denominator.
CONTRAST:  8.5mL GADAVIST GADOBUTROL 1 MMOL/ML IV SOLN

[Series 5: 3d cow · axial · 0.5mm · 0.41mm/px · z∈[-59,+22]mm · 18 of 172 slices shown]
[im 1/172]
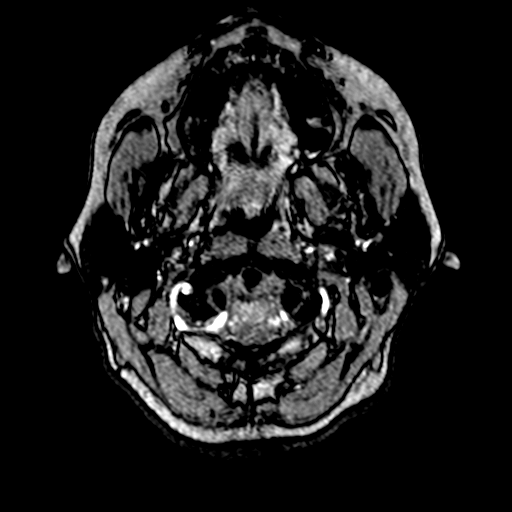
[im 4/172]
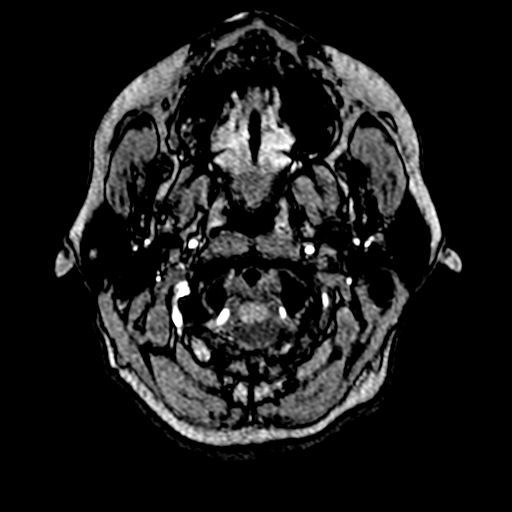
[im 8/172]
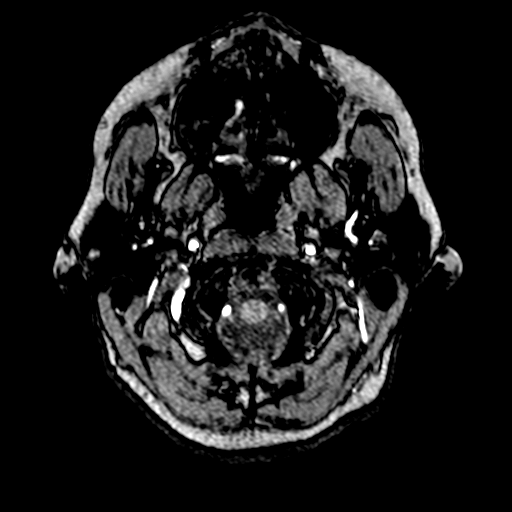
[im 11/172]
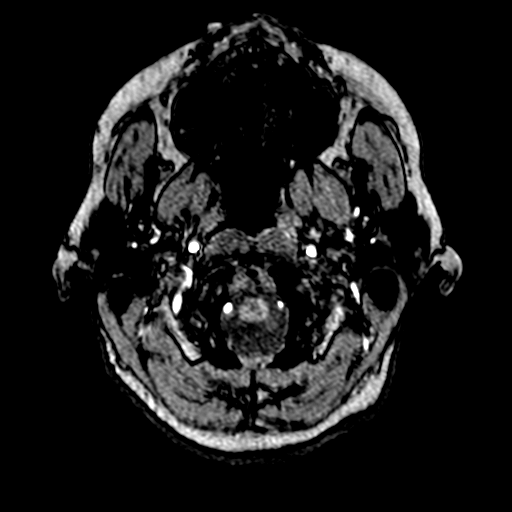
[im 15/172]
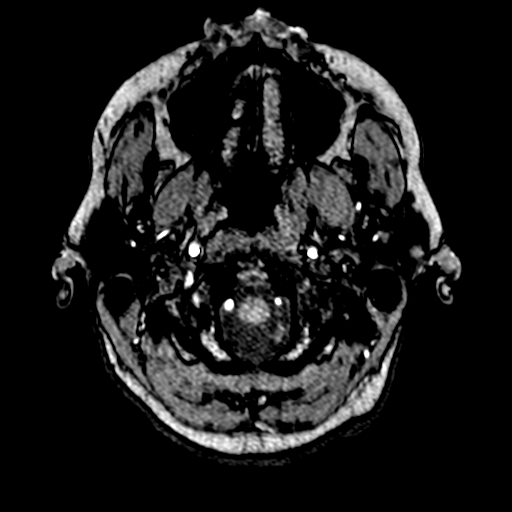
[im 19/172]
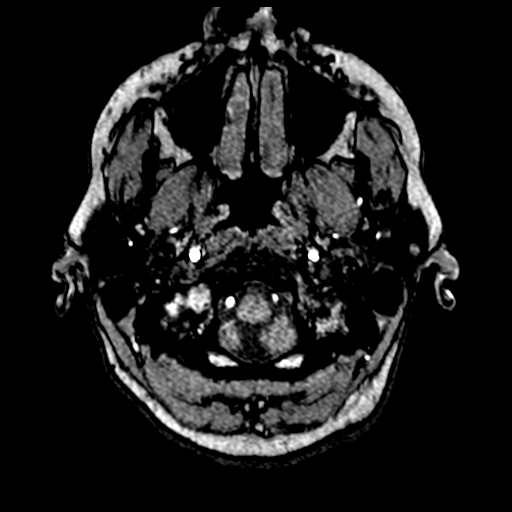
[im 22/172]
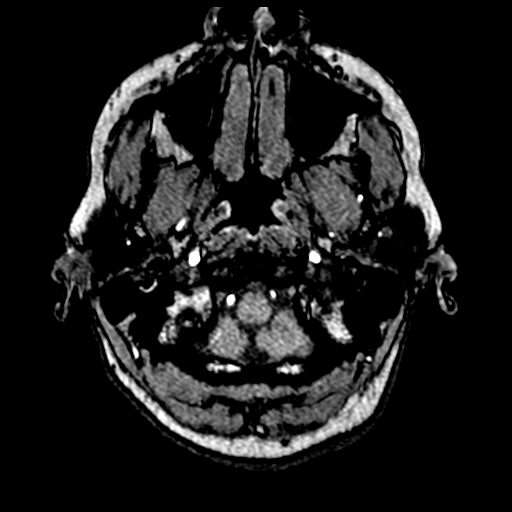
[im 26/172]
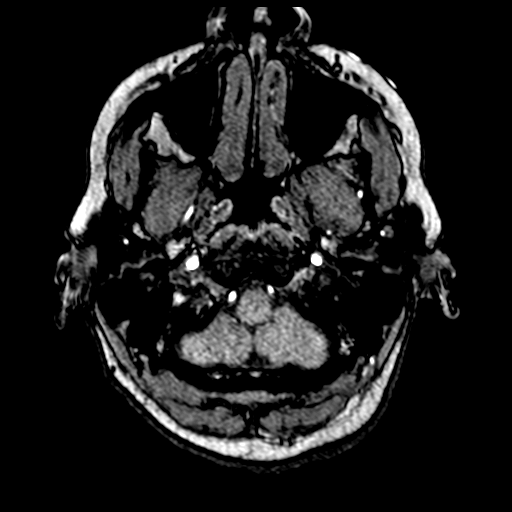
[im 30/172]
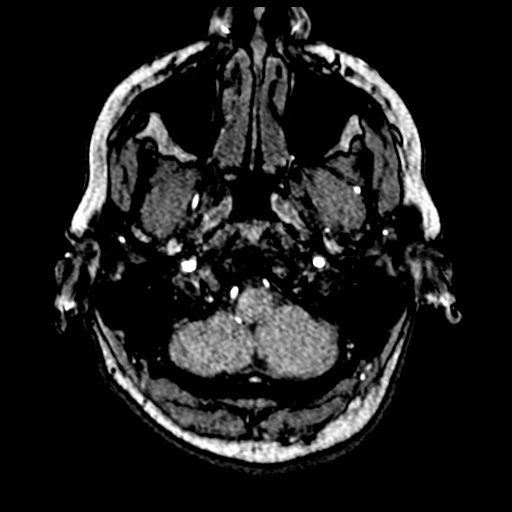
[im 33/172]
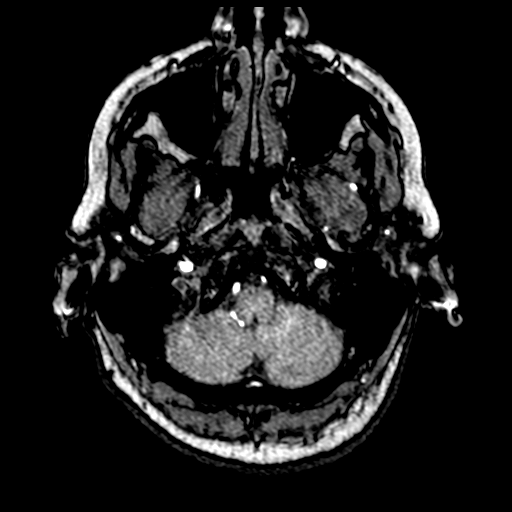
[im 55/172]
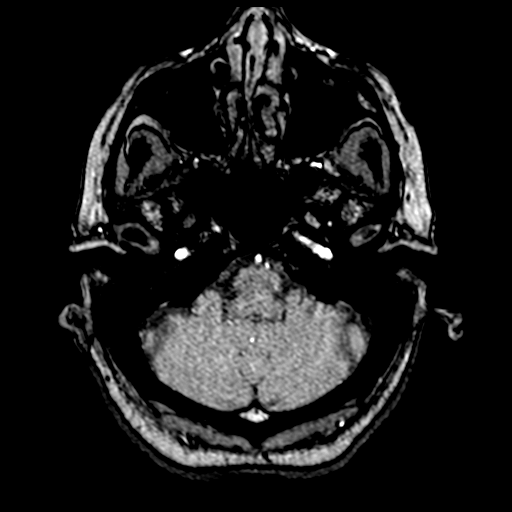
[im 77/172]
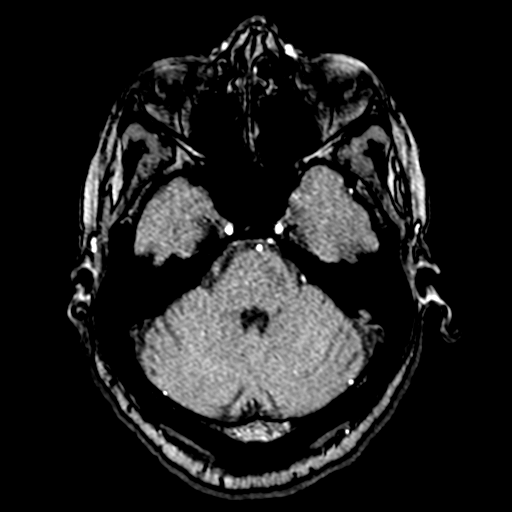
[im 88/172]
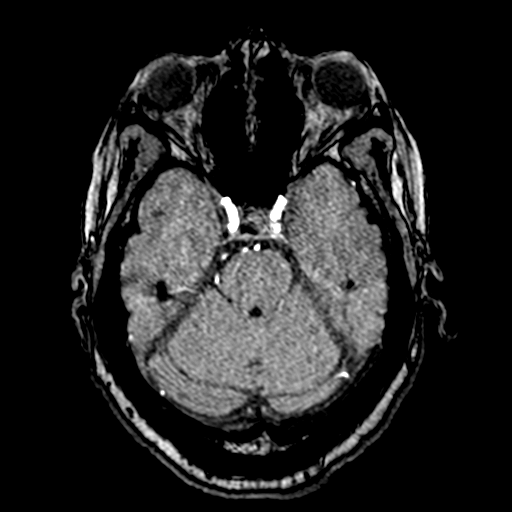
[im 99/172]
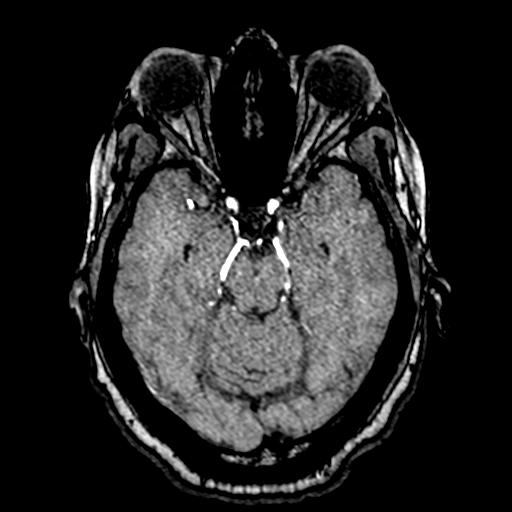
[im 121/172]
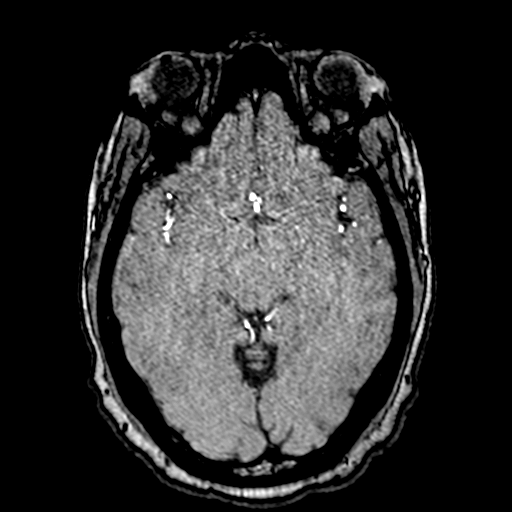
[im 142/172]
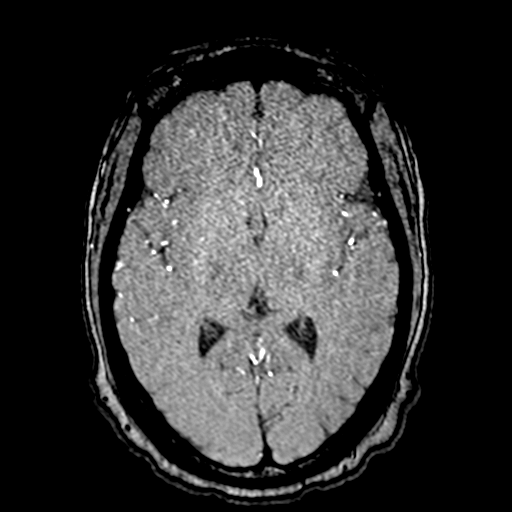
[im 146/172]
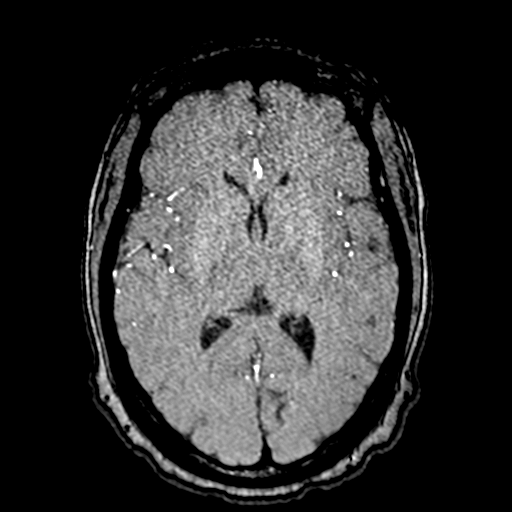
[im 164/172]
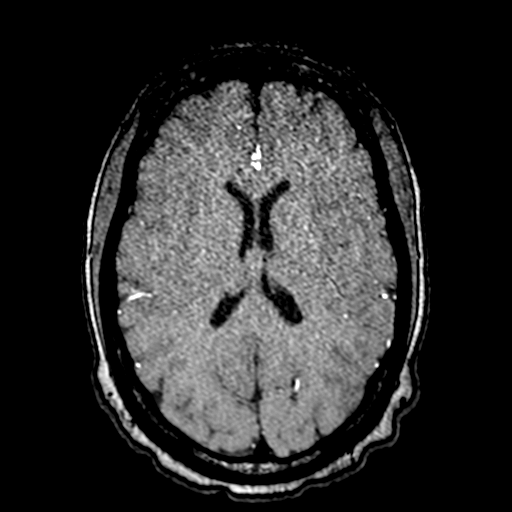

[18 of 48 positions shown; findings below may reference images not displayed]

FINDINGS: MRA HEAD FINDINGS

ANTERIOR CIRCULATION:

Examination mildly degraded by motion artifact.

Visualized distal cervical segments of the internal carotid arteries
are patent with antegrade flow. Petrous segments widely patent.
Probable mild atheromatous irregularity throughout the carotid
siphons with no more than mild stenosis bilaterally. Right A1
segment widely patent. Possible short-segment severe stenosis at the
proximal left A1 segment (series [50], image 11). Normal anterior
communicating artery complex. A2 segments patent distally without
stenosis. No M1 stenosis or occlusion. Normal MCA bifurcations.
Distal MCA branches well perfused and symmetric.

POSTERIOR CIRCULATION:

Both vertebral arteries patent to the vertebrobasilar junction
without stenosis. Right vertebral artery dominant. Right PICA
patent. Left PICA not seen. Basilar mildly irregular but is patent
to its distal aspect without stenosis. Superior cerebellar arteries
patent bilaterally. Both PCA supplied via small bilateral P1
segments as well as robust bilateral posterior communicating
arteries. PCAs well perfused to their distal aspects without
stenosis.

No intracranial aneurysm.

MRA NECK FINDINGS

AORTIC ARCH: Visualized aortic arch normal in caliber with normal
branch pattern. No hemodynamically significant stenosis seen about
the origin of the great vessels.

RIGHT CAROTID SYSTEM: Right common and internal carotid arteries
patent without stenosis, evidence for dissection or occlusion. No
significant atheromatous narrowing or irregularity about the right
bifurcation.

LEFT CAROTID SYSTEM: Left common and internal carotid arteries
patent without stenosis, evidence for dissection, or occlusion. No
significant atheromatous narrowing or irregularity about the left
bifurcation.

VERTEBRAL ARTERIES: Right vertebral artery strongly dominant and
arises from the right subclavian artery. No proximal subclavian
artery stenosis. Left vertebral artery diffusely hypoplastic and
appears to arise directly from the aortic arch. Origin of the left
vertebral artery not well seen on this exam. Visualized portions of
the vertebral arteries patent without stenosis, evidence for
dissection, or occlusion.
IMPRESSION: MRA HEAD:

1. Motion degraded exam.
2. Question short-segment severe stenosis at the proximal left A1
segment.
3. Atherosclerotic irregularity throughout the carotid siphons with
no more than mild stenosis bilaterally.
4. Otherwise negative intracranial MRA. No large vessel occlusion.
No other hemodynamically significant or correctable stenosis.

MRA NECK:

1. Negative MRA of the neck.
2. No hemodynamically significant stenosis about either carotid
artery system.
3. Both vertebral arteries widely patent within the neck. Right
vertebral artery dominant.

## 2021-04-07 IMAGING — MR MR MRA NECK WO/W CM
7 of 8 series · 40 of 48 positions shown · IV contrast (gadavist)
Comparison: Prior MRI from [DATE].

CLINICAL DATA: Initial evaluation for acute left-sided numbness.

EXAM:
MRA HEAD WITHOUT CONTRAST
MRA NECK WITHOUT AND WITH CONTRAST
TECHNIQUE: Angiographic images of the Circle of Willis were acquired using MRA
technique without intravenous contrast. Angiographic images of the
neck were acquired using MRA technique without and with intravenous
contrast. Carotid stenosis measurements (when applicable) are
obtained utilizing NASCET criteria, using the distal internal
carotid diameter as the denominator.
CONTRAST:  8.5mL GADAVIST GADOBUTROL 1 MMOL/ML IV SOLN

[Series 15: tof_fl3d_tra_iso · axial · 0.6mm · 0.52mm/px · z∈[-152,-75]mm · 8 of 133 slices shown]
[im 1/133]
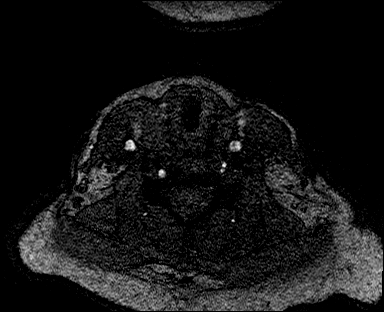
[im 19/133]
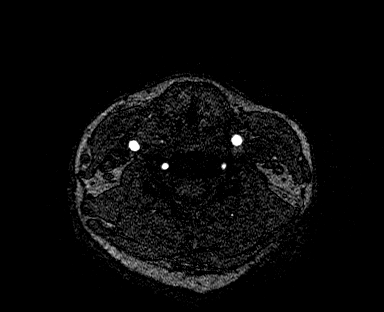
[im 38/133]
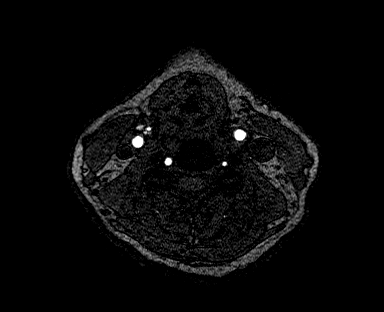
[im 57/133]
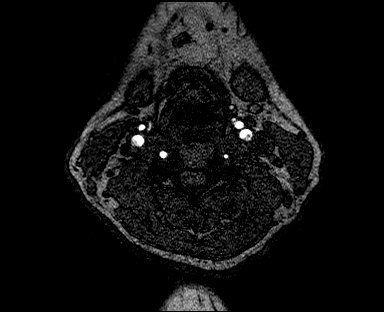
[im 76/133]
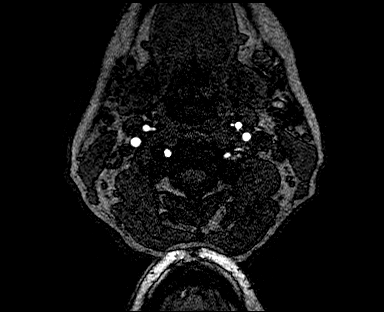
[im 95/133]
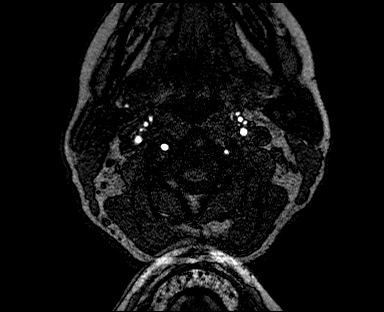
[im 114/133]
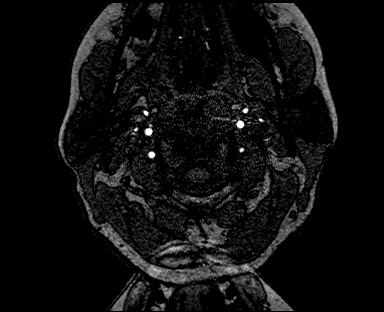
[im 133/133]
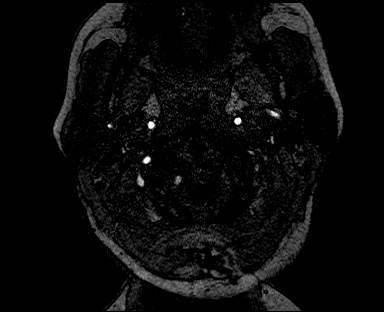

[Series 18: angio_fl3d_cor_pre_ttc=3.0s · coronal · 0.9mm · 0.85mm/px · 6 of 80 slices shown]
[im 1/80]
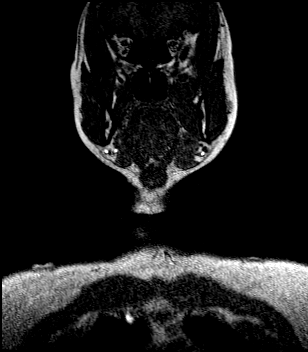
[im 16/80]
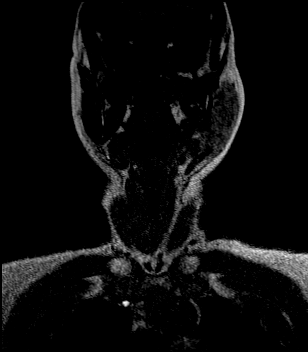
[im 32/80]
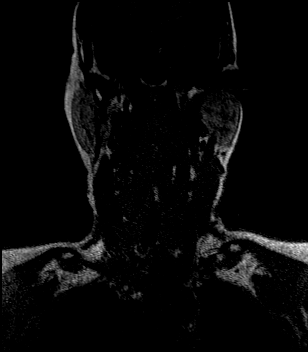
[im 48/80]
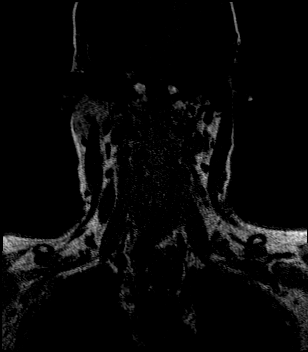
[im 64/80]
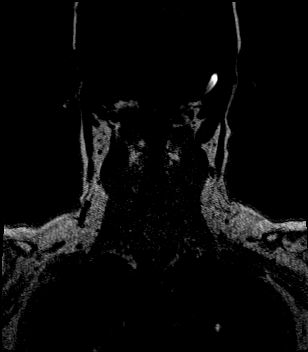
[im 80/80]
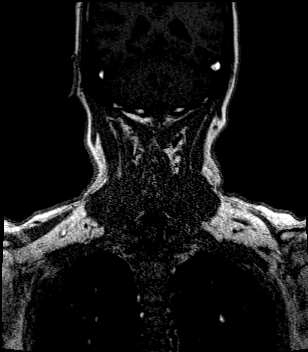

[Series 20: angio_fl3d_cor_post_ttc=3.0s · coronal · 0.9mm · 0.85mm/px · 6 of 80 slices shown (1 of 2)]
[im 1/80]
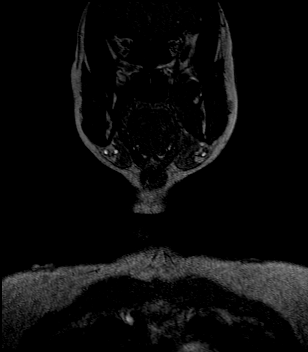
[im 16/80]
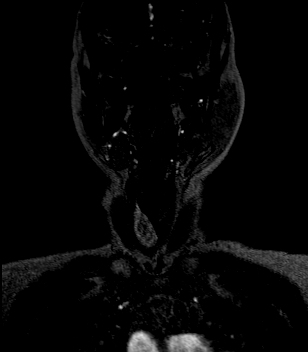
[im 32/80]
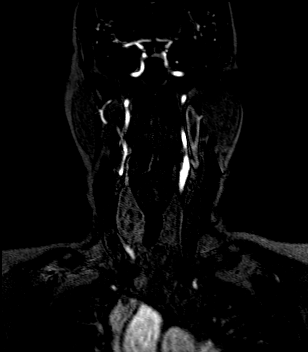
[im 48/80]
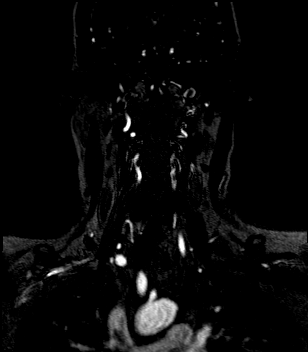
[im 64/80]
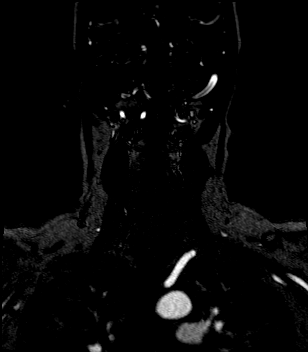
[im 80/80]
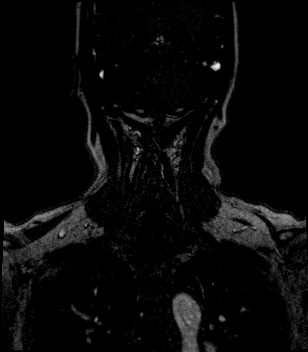

[Series 21: angio_fl3d_cor_post_ttc=3.0s_moco-adv · coronal · 0.9mm · 0.85mm/px · 6 of 80 slices shown (1 of 2)]
[im 1/80]
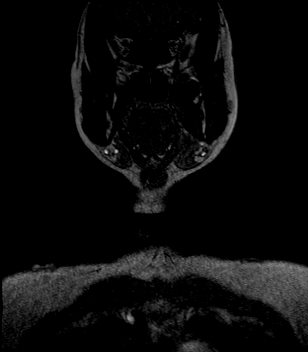
[im 16/80]
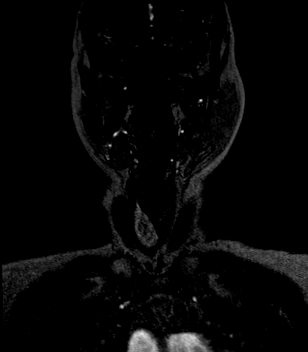
[im 32/80]
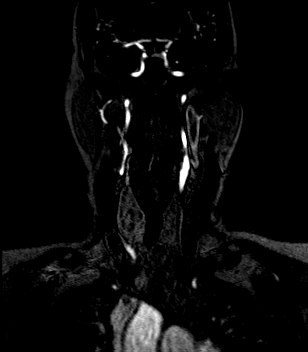
[im 48/80]
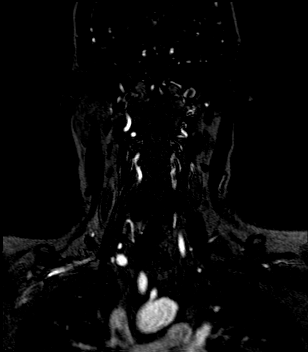
[im 64/80]
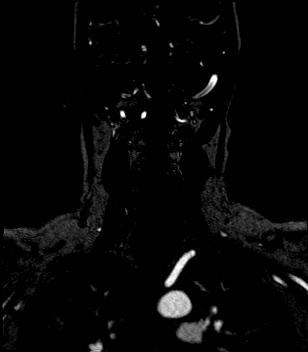
[im 80/80]
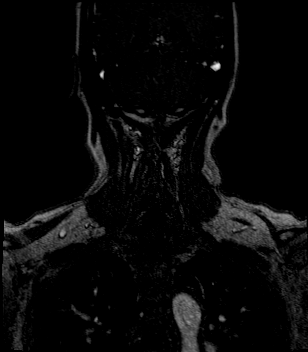

[Series 22: angio_fl3d_cor_post_ttc=3.0s_moco-adv_sub · coronal · 0.9mm · 0.85mm/px · 5 of 76 slices shown]
[im 1/76]
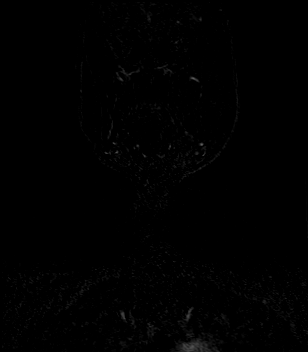
[im 19/76]
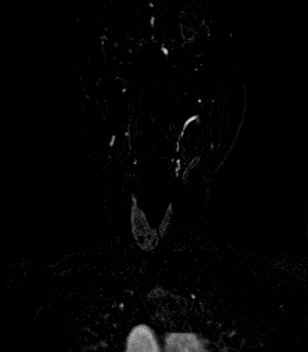
[im 38/76]
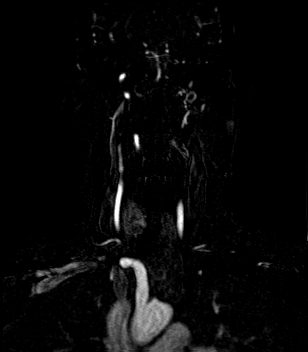
[im 57/76]
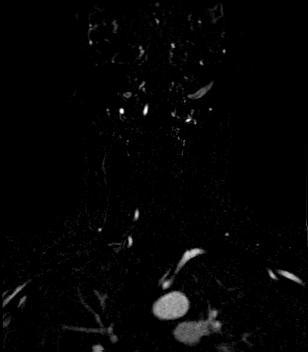
[im 76/76]
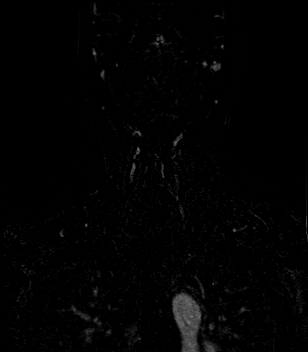

[Series 24: angio_fl3d_cor_post_ttc=3.0s · coronal · 0.9mm · 0.85mm/px · 6 of 80 slices shown (2 of 2)]
[im 1/80]
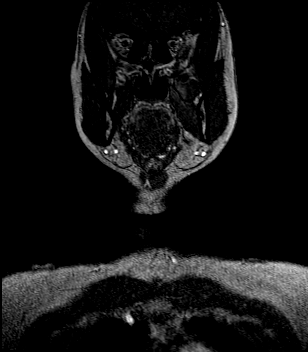
[im 16/80]
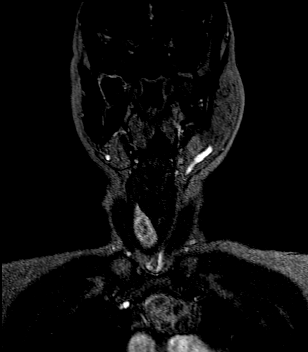
[im 32/80]
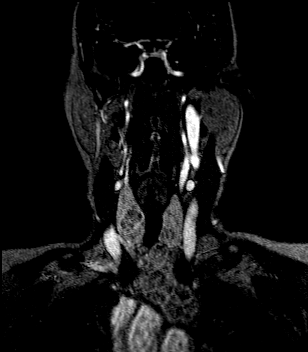
[im 48/80]
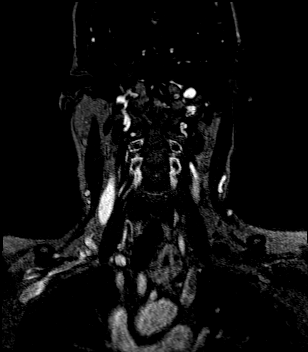
[im 64/80]
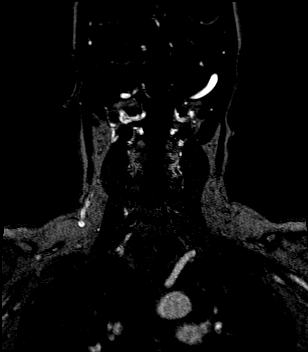
[im 80/80]
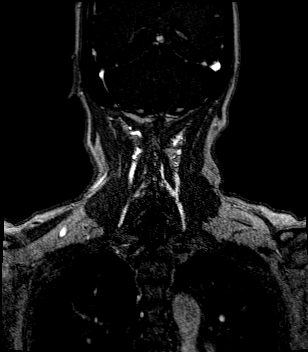

[Series 25: angio_fl3d_cor_post_ttc=3.0s_moco-adv · coronal · 0.9mm · 0.85mm/px · 3 of 80 slices shown (2 of 2)]
[im 1/80]
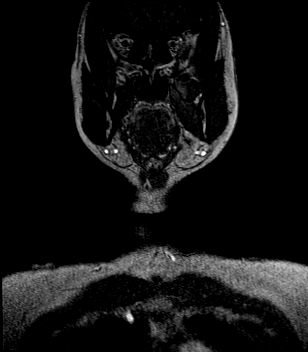
[im 16/80]
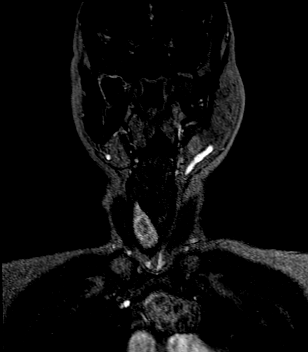
[im 32/80]
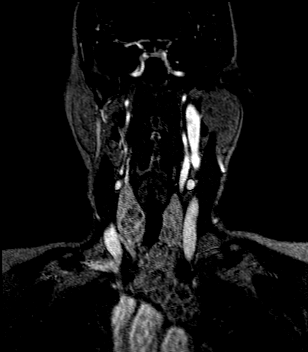

[40 of 48 positions shown; findings below may reference images not displayed]

FINDINGS: MRA HEAD FINDINGS

ANTERIOR CIRCULATION:

Examination mildly degraded by motion artifact.

Visualized distal cervical segments of the internal carotid arteries
are patent with antegrade flow. Petrous segments widely patent.
Probable mild atheromatous irregularity throughout the carotid
siphons with no more than mild stenosis bilaterally. Right A1
segment widely patent. Possible short-segment severe stenosis at the
proximal left A1 segment (series [50], image 11). Normal anterior
communicating artery complex. A2 segments patent distally without
stenosis. No M1 stenosis or occlusion. Normal MCA bifurcations.
Distal MCA branches well perfused and symmetric.

POSTERIOR CIRCULATION:

Both vertebral arteries patent to the vertebrobasilar junction
without stenosis. Right vertebral artery dominant. Right PICA
patent. Left PICA not seen. Basilar mildly irregular but is patent
to its distal aspect without stenosis. Superior cerebellar arteries
patent bilaterally. Both PCA supplied via small bilateral P1
segments as well as robust bilateral posterior communicating
arteries. PCAs well perfused to their distal aspects without
stenosis.

No intracranial aneurysm.

MRA NECK FINDINGS

AORTIC ARCH: Visualized aortic arch normal in caliber with normal
branch pattern. No hemodynamically significant stenosis seen about
the origin of the great vessels.

RIGHT CAROTID SYSTEM: Right common and internal carotid arteries
patent without stenosis, evidence for dissection or occlusion. No
significant atheromatous narrowing or irregularity about the right
bifurcation.

LEFT CAROTID SYSTEM: Left common and internal carotid arteries
patent without stenosis, evidence for dissection, or occlusion. No
significant atheromatous narrowing or irregularity about the left
bifurcation.

VERTEBRAL ARTERIES: Right vertebral artery strongly dominant and
arises from the right subclavian artery. No proximal subclavian
artery stenosis. Left vertebral artery diffusely hypoplastic and
appears to arise directly from the aortic arch. Origin of the left
vertebral artery not well seen on this exam. Visualized portions of
the vertebral arteries patent without stenosis, evidence for
dissection, or occlusion.
IMPRESSION: MRA HEAD:

1. Motion degraded exam.
2. Question short-segment severe stenosis at the proximal left A1
segment.
3. Atherosclerotic irregularity throughout the carotid siphons with
no more than mild stenosis bilaterally.
4. Otherwise negative intracranial MRA. No large vessel occlusion.
No other hemodynamically significant or correctable stenosis.

MRA NECK:

1. Negative MRA of the neck.
2. No hemodynamically significant stenosis about either carotid
artery system.
3. Both vertebral arteries widely patent within the neck. Right
vertebral artery dominant.

## 2021-04-07 MED ORDER — ATORVASTATIN CALCIUM 40 MG PO TABS
40.0000 mg | ORAL_TABLET | Freq: Every day | ORAL | Status: DC
  Administered 2021-04-07: 40 mg via ORAL
  Filled 2021-04-07: qty 1

## 2021-04-07 MED ORDER — GADOBUTROL 1 MMOL/ML IV SOLN
8.5000 mL | Freq: Once | INTRAVENOUS | Status: AC | PRN
  Administered 2021-04-07: 8.5 mL via INTRAVENOUS

## 2021-04-07 MED ORDER — ACCU-CHEK GUIDE W/DEVICE KIT
PACK | 0 refills | Status: AC
  Filled 2021-04-07: qty 1, 1d supply, fill #0

## 2021-04-07 MED ORDER — ASPIRIN 81 MG PO TBEC
81.0000 mg | DELAYED_RELEASE_TABLET | Freq: Every day | ORAL | 1 refills | Status: DC
  Filled 2021-04-07: qty 30, 30d supply, fill #0

## 2021-04-07 MED ORDER — ACCU-CHEK GUIDE VI STRP
ORAL_STRIP | 12 refills | Status: AC
  Filled 2021-04-07: qty 100, 25d supply, fill #0

## 2021-04-07 MED ORDER — ASPIRIN EC 81 MG PO TBEC
81.0000 mg | DELAYED_RELEASE_TABLET | Freq: Every day | ORAL | Status: DC
  Administered 2021-04-07: 81 mg via ORAL
  Filled 2021-04-07: qty 1

## 2021-04-07 MED ORDER — GLIMEPIRIDE 2 MG PO TABS
2.0000 mg | ORAL_TABLET | ORAL | 0 refills | Status: DC
  Filled 2021-04-07: qty 30, 30d supply, fill #0

## 2021-04-07 MED ORDER — ACCU-CHEK SOFTCLIX LANCETS MISC
5 refills | Status: DC
  Filled 2021-04-07: qty 100, 25d supply, fill #0

## 2021-04-07 MED ORDER — CLOPIDOGREL BISULFATE 75 MG PO TABS
75.0000 mg | ORAL_TABLET | Freq: Every day | ORAL | 0 refills | Status: AC
  Filled 2021-04-07: qty 21, 21d supply, fill #0

## 2021-04-07 MED ORDER — BLOOD GLUCOSE MONITOR KIT
PACK | 0 refills | Status: DC
  Filled 2021-04-07: qty 1, fill #0

## 2021-04-07 MED ORDER — LISINOPRIL 10 MG PO TABS
10.0000 mg | ORAL_TABLET | Freq: Every day | ORAL | 0 refills | Status: DC
  Filled 2021-04-07: qty 30, 30d supply, fill #0

## 2021-04-07 MED ORDER — SODIUM CHLORIDE 0.9% FLUSH
10.0000 mL | Freq: Once | INTRAVENOUS | Status: AC
  Administered 2021-04-07: 10 mL via INTRAVENOUS

## 2021-04-07 MED ORDER — ATORVASTATIN CALCIUM 40 MG PO TABS
40.0000 mg | ORAL_TABLET | Freq: Every day | ORAL | 1 refills | Status: DC
  Filled 2021-04-07: qty 30, 30d supply, fill #0

## 2021-04-07 MED ORDER — CLOPIDOGREL BISULFATE 75 MG PO TABS
75.0000 mg | ORAL_TABLET | Freq: Every day | ORAL | Status: DC
  Administered 2021-04-07: 75 mg via ORAL
  Filled 2021-04-07: qty 1

## 2021-04-07 NOTE — Discharge Summary (Addendum)
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   04/07/21 1300 04/07/21 1400 04/07/21 1415 04/07/21 1635  BP: (!) 159/100 (!) 155/94 (!) 135/99   Pulse: 92 95 (!) 104   Resp: 15 (!) 24 (!) 24   Temp:    98.2 F (36.8 C)  TempSrc:    Oral  SpO2: 99% 99% 99%     General: Young female, moderately built and nourished seen ambulating steadily in the hall and then lying comfortably in bed without distress. Cardiovascular: S1 & S2 heard, RRR, S1/S2 +. No murmurs, rubs, gallops or clicks. No JVD or pedal edema.  Telemetry reviewed: Sinus rhythm. Respiratory: Clear to auscultation without wheezing, rhonchi or crackles. No increased work of breathing. Abdominal:  Non distended, non tender & soft. No organomegaly or masses appreciated. Normal bowel sounds heard. CNS: Alert and oriented. No focal deficits. Extremities: no edema, no cyanosis    The results of significant diagnostics from this  hospitalization (including imaging, microbiology, ancillary and laboratory) are listed below for reference.     Microbiology: Recent Results (from the  past 240 hour(s))  Resp Panel by RT-PCR (Flu A&B, Covid) Nasopharyngeal Swab     Status: None   Collection Time: 04/06/21  5:22 PM   Specimen: Nasopharyngeal Swab; Nasopharyngeal(NP) swabs in vial transport medium  Result Value Ref Range Status   SARS Coronavirus 2 by RT PCR NEGATIVE NEGATIVE Final    Comment: (NOTE) SARS-CoV-2 target nucleic acids are NOT DETECTED.  The SARS-CoV-2 RNA is generally detectable in upper respiratory specimens during the acute phase of infection. The lowest concentration of SARS-CoV-2 viral copies this assay can detect is 138 copies/mL. A negative result does not preclude SARS-Cov-2 infection and should not be used as the sole basis for treatment or other patient management decisions. A negative result may occur with  improper specimen collection/handling, submission of specimen other than nasopharyngeal swab, presence of viral mutation(s) within the areas targeted by this assay, and inadequate number of viral copies(<138 copies/mL). A negative result must be combined with clinical observations, patient history, and epidemiological information. The expected result is Negative.  Fact Sheet for Patients:  EntrepreneurPulse.com.au  Fact Sheet for Healthcare Providers:  IncredibleEmployment.be  This test is no t yet approved or cleared by the [place] FDA and  has been authorized for detection and/or diagnosis of SARS-CoV-2 by FDA under an Emergency Use Authorization (EUA). This EUA will remain  in effect (meaning this test can be used) for the duration of the COVID-19 declaration under Section 564(b)(1) of the Act, 21 U.S.C.section 360bbb-3(b)(1), unless the authorization is terminated  or revoked sooner.       Influenza A by PCR NEGATIVE NEGATIVE Final   Influenza B by PCR NEGATIVE NEGATIVE Final    Comment: (NOTE) The Xpert Xpress SARS-CoV-2/FLU/RSV plus assay is intended as an aid in the diagnosis of  influenza from Nasopharyngeal swab specimens and should not be used as a sole basis for treatment. Nasal washings and aspirates are unacceptable for Xpert Xpress SARS-CoV-2/FLU/RSV testing.  Fact Sheet for Patients: EntrepreneurPulse.com.au  Fact Sheet for Healthcare Providers: IncredibleEmployment.be  This test is not yet approved or cleared by the [place] FDA and has been authorized for detection and/or diagnosis of SARS-CoV-2 by FDA under an Emergency Use Authorization (EUA). This EUA will remain in effect (meaning this test can be used) for the duration of the COVID-19 declaration under Section 564(b)(1) of the Act, 21 U.S.C. section 360bbb-3(b)(1), unless the authorization is terminated or revoked.  Performed at [facility], [place] [address] [place], [place] [address]      Labs: CBC: Recent Labs  Lab 04/06/21 1420 04/06/21 1432  WBC 8.1  --   NEUTROABS 5.7  --   HGB 13.2 15.6*  HCT 44.5 46.0  MCV 76.3*  --   PLT 288  --     Basic Metabolic Panel: Recent Labs  Lab 04/06/21 1420 04/06/21 1432  NA 135 138  K 4.3 4.6  CL 100 100  CO2 26  --   GLUCOSE 408* 403*  BUN 12 14  CREATININE 0.75 0.70  CALCIUM 8.6*  --     Liver Function Tests: Recent Labs  Lab 04/06/21 1420  AST 15  ALT 21  ALKPHOS 114  BILITOT 0.9  PROT 6.8  ALBUMIN 3.2*    CBG: Recent Labs  Lab 04/07/21 0118 04/07/21 0913 04/07/21 1148 04/07/21 1710  GLUCAP 247* 357* 348* 261*  Hgb A1c No results for input(s): HGBA1C in the last 72 hours.  Lipid Profile Recent Labs    04/06/21 2013 04/07/21 0500  CHOL  --  257*  HDL  --  49  LDLCALC  --  179*  TRIG  --  145  CHOLHDL  --  5.2  LDLDIRECT 227.4*  --      Urinalysis    Component Value Date/Time   COLORURINE YELLOW 04/06/2021 1722   APPEARANCEUR CLEAR 04/06/2021 1722   LABSPEC 1.033 (H) 04/06/2021 1722   PHURINE 7.0 04/06/2021 1722   GLUCOSEU >=500 (A) 04/06/2021  1722   HGBUR NEGATIVE 04/06/2021 1722   BILIRUBINUR NEGATIVE 04/06/2021 1722   KETONESUR 20 (A) 04/06/2021 1722   PROTEINUR NEGATIVE 04/06/2021 1722   UROBILINOGEN 1.0 06/03/2011 1603   NITRITE NEGATIVE 04/06/2021 1722   LEUKOCYTESUR NEGATIVE 04/06/2021 1722      Time coordinating discharge: 45 minutes  SIGNED:  Vernell Leep, MD, FACP, [facility]. [facility]  To contact the attending provider between 7A-7P or the covering provider during after hours 7P-7A, please log into the web site [email or web address] and access using universal [place] password for that web site. If you do not have the password, please call the hospital operator.

## 2021-04-07 NOTE — Progress Notes (Signed)
  Echocardiogram 2D Echocardiogram with 3D, strain, and bubble study has been performed.  Leta Jungling M 04/07/2021, 10:36 AM

## 2021-04-07 NOTE — ED Notes (Signed)
Pt in MRI.

## 2021-04-07 NOTE — Progress Notes (Signed)
   04/07/21 1900  TOC ED Mini Assessment  TOC Time spent with patient (minutes): 20  PING Used in TOC Assessment No  Admission or Readmission Diverted No  Interventions which prevented an admission or readmission Other (must enter comment)  What brought you to the Emergency Department?  Pt presented with  complains of numbness, paresthesia and tingling on the left side of the face and arm.  Barrier interventions ED RNCM recieved message from Dr. Lavella Lemons  to assist with transitions of care needs. Patient is uninsured and does not have a PCP.  RNCM met with patient and provided information concerning, Prescriptions sent to  [facility] which will be delivered to ED along with a glucometer prior to discharge, and HFU appt arranged with [facility] 6/9 at 11am. Patient is agreeable and states she will have transportation. Updated ED RN and Dr. Zandra Abts on the plan. No further RNCM needs identifed  Means of departure Taxi

## 2021-04-07 NOTE — Progress Notes (Signed)
Paged by Dr. Waymon Amato at 4pm regarding this pt.  Dr. Waymon Amato asked me to please contact the pt and make sure she is counseled about diabetes care at home prior to d/c this afternoon.  Called pt by phone.  Spoke w/ pt for about 20 minutes and reviewed basic diabetes care for home.  Pt told me she hasn't checked a CBG in many years and when she was checking it was always >200.  Stopped Metformin over 3 years ago due to side effects but was not able to verbalize to me what the side effects were that she had with the Metformin.  Admits to poor eating habits at home and at work (sometimes only eats one meal per day, drinks a lot of regular mountain dew, has been eating a lot of food at home that was for a recent funeral, and admitted to eating honey buns).  Does not have a working CBG meter at home.  Is NOT willing to retry Metformin but is willing to try other oral medications (refusing insulin).  Reviewed Basic pathophysiology of DM Type 2, basic home care, basic diabetes diet nutrition principles, importance of checking CBGs and maintaining good CBG control to prevent long-term and short-term complications.  Reviewed signs and symptoms of hyperglycemia and hypoglycemia and how to treat hypoglycemia at home.  Also reviewed blood sugar goals and A1c goals for home.    Strongly encouraged pt to attend her upcoming clinic appt and also strongly encouraged pt to go to [facility] and purchase an OTC CBG meter and begin checking CBGs ta home at least 1-2 times per day (either before or 2 hours after meals).  Pt told me she feels bad when her CBG is below 200.  I asked pt how she knows when her CBG is <200 if she doesn't check CBGs.  Discussed with pt that if we help her get her CBGs consistently <200 at home, she will not likely feel bad.  I emphasized the importance of prevention of Cardiac events and organ damage.  We also reviewed basic nutrition for home and I strongly encouragd her to eat smaller, more frequent  meals at home keeping carbohydrates to smaller portion sizes, eliminating regular mountain dew and fruit juice, and exercising more frequently at home.  Pt seemed agreeable to trying the Amaryl medication at home and to try to make better food choices at home as well.    --Will follow patient during hospitalization--  Ambrose Finland RN, MSN, CDE Diabetes Coordinator Inpatient Glycemic Control Team Team Pager: [phone] (8a-5p)

## 2021-04-07 NOTE — ED Notes (Signed)
[phone] Pt's correct phone number

## 2021-04-07 NOTE — ED Notes (Addendum)
Pt refused insulin and Jardiance. Dr. Rachael Darby notified.

## 2021-04-07 NOTE — ED Notes (Signed)
Pt provided extended education by this RN and Londell Moh from [facility], return demonstration provided with glucometer at bedside. All medications and med list reviewed. Pt verbalized understanding of pending appointments and follow up as well as stroke s/s and when to return emergently to the ER.

## 2021-04-07 NOTE — Progress Notes (Addendum)
STROKE TEAM PROGRESS NOTE   INTERVAL HISTORY She is still in the ER awaiting a bed upstairs. No return of symptoms today. MRI neg for stroke. Reviewed stroke risk factors.  Vitals:   04/07/21 0700 04/07/21 0728 04/07/21 1001 04/07/21 1030  BP: (!) 146/115  (!) 169/107 (!) 159/89  Pulse: 95   90  Resp: 14   18  Temp:  98 F (36.7 C)    TempSrc:  Oral    SpO2: 97%  97% 98%   CBC:  Recent Labs  Lab 04/06/21 1420 04/06/21 1432  WBC 8.1  --   NEUTROABS 5.7  --   HGB 13.2 15.6*  HCT 44.5 46.0  MCV 76.3*  --   PLT 288  --    Basic Metabolic Panel:  Recent Labs  Lab 04/06/21 1420 04/06/21 1432  NA 135 138  K 4.3 4.6  CL 100 100  CO2 26  --   GLUCOSE 408* 403*  BUN 12 14  CREATININE 0.75 0.70  CALCIUM 8.6*  --    Lipid Panel:  Recent Labs  Lab 04/07/21 0500  CHOL 257*  TRIG 145  HDL 49  CHOLHDL 5.2  VLDL 29  LDLCALC 536*   HgbA1c: No results for input(s): HGBA1C in the last 168 hours. Urine Drug Screen:  Recent Labs  Lab 04/06/21 1722  LABOPIA NONE DETECTED  COCAINSCRNUR NONE DETECTED  LABBENZ NONE DETECTED  AMPHETMU NONE DETECTED  THCU NONE DETECTED  LABBARB NONE DETECTED    Alcohol Level  Recent Labs  Lab 04/06/21 1420  ETH <10    IMAGING past 24 hours CT Head Wo Contrast  Result Date: 04/06/2021 CLINICAL DATA:  Left-sided numbness and tingling since this morning. EXAM: CT HEAD WITHOUT CONTRAST TECHNIQUE: Contiguous axial images were obtained from the base of the skull through the vertex without intravenous contrast. COMPARISON:  None. FINDINGS: Brain: No evidence of acute infarction, hemorrhage, hydrocephalus, extra-axial collection or mass lesion/mass effect. Vascular: Atherosclerotic vascular calcification of the carotid siphons. No hyperdense vessel. Skull: Normal. Negative for fracture or focal lesion. Sinuses/Orbits: No acute finding. Other: None. IMPRESSION: 1. Normal noncontrast head CT. Electronically Signed   By: Obie Dredge M.D.   On:  04/06/2021 15:53   MR ANGIO HEAD WO CONTRAST  Result Date: 04/07/2021 CLINICAL DATA:  Initial evaluation for acute left-sided numbness. EXAM: MRA HEAD WITHOUT CONTRAST MRA NECK WITHOUT AND WITH CONTRAST TECHNIQUE: Angiographic images of the Circle of Willis were acquired using MRA technique without intravenous contrast. Angiographic images of the neck were acquired using MRA technique without and with intravenous contrast. Carotid stenosis measurements (when applicable) are obtained utilizing NASCET criteria, using the distal internal carotid diameter as the denominator. CONTRAST:  8.28mL GADAVIST GADOBUTROL 1 MMOL/ML IV SOLN COMPARISON:  Prior MRI from 04/06/2021. FINDINGS: MRA HEAD FINDINGS ANTERIOR CIRCULATION: Examination mildly degraded by motion artifact. Visualized distal cervical segments of the internal carotid arteries are patent with antegrade flow. Petrous segments widely patent. Probable mild atheromatous irregularity throughout the carotid siphons with no more than mild stenosis bilaterally. Right A1 segment widely patent. Possible short-segment severe stenosis at the proximal left A1 segment (series 1095, image 11). Normal anterior communicating artery complex. A2 segments patent distally without stenosis. No M1 stenosis or occlusion. Normal MCA bifurcations. Distal MCA branches well perfused and symmetric. POSTERIOR CIRCULATION: Both vertebral arteries patent to the vertebrobasilar junction without stenosis. Right vertebral artery dominant. Right PICA patent. Left PICA not seen. Basilar mildly irregular but is patent to its distal  aspect without stenosis. Superior cerebellar arteries patent bilaterally. Both PCA supplied via small bilateral P1 segments as well as robust bilateral posterior communicating arteries. PCAs well perfused to their distal aspects without stenosis. No intracranial aneurysm. MRA NECK FINDINGS AORTIC ARCH: Visualized aortic arch normal in caliber with normal branch pattern.  No hemodynamically significant stenosis seen about the origin of the great vessels. RIGHT CAROTID SYSTEM: Right common and internal carotid arteries patent without stenosis, evidence for dissection or occlusion. No significant atheromatous narrowing or irregularity about the right bifurcation. LEFT CAROTID SYSTEM: Left common and internal carotid arteries patent without stenosis, evidence for dissection, or occlusion. No significant atheromatous narrowing or irregularity about the left bifurcation. VERTEBRAL ARTERIES: Right vertebral artery strongly dominant and arises from the right subclavian artery. No proximal subclavian artery stenosis. Left vertebral artery diffusely hypoplastic and appears to arise directly from the aortic arch. Origin of the left vertebral artery not well seen on this exam. Visualized portions of the vertebral arteries patent without stenosis, evidence for dissection, or occlusion. IMPRESSION: MRA HEAD: 1. Motion degraded exam. 2. Question short-segment severe stenosis at the proximal left A1 segment. 3. Atherosclerotic irregularity throughout the carotid siphons with no more than mild stenosis bilaterally. 4. Otherwise negative intracranial MRA. No large vessel occlusion. No other hemodynamically significant or correctable stenosis. MRA NECK: 1. Negative MRA of the neck. 2. No hemodynamically significant stenosis about either carotid artery system. 3. Both vertebral arteries widely patent within the neck. Right vertebral artery dominant. Electronically Signed   By: Rise Mu M.D.   On: 04/07/2021 02:40   MR ANGIO NECK W WO CONTRAST  Result Date: 04/07/2021 CLINICAL DATA:  Initial evaluation for acute left-sided numbness. EXAM: MRA HEAD WITHOUT CONTRAST MRA NECK WITHOUT AND WITH CONTRAST TECHNIQUE: Angiographic images of the Circle of Willis were acquired using MRA technique without intravenous contrast. Angiographic images of the neck were acquired using MRA technique without  and with intravenous contrast. Carotid stenosis measurements (when applicable) are obtained utilizing NASCET criteria, using the distal internal carotid diameter as the denominator. CONTRAST:  8.82mL GADAVIST GADOBUTROL 1 MMOL/ML IV SOLN COMPARISON:  Prior MRI from 04/06/2021. FINDINGS: MRA HEAD FINDINGS ANTERIOR CIRCULATION: Examination mildly degraded by motion artifact. Visualized distal cervical segments of the internal carotid arteries are patent with antegrade flow. Petrous segments widely patent. Probable mild atheromatous irregularity throughout the carotid siphons with no more than mild stenosis bilaterally. Right A1 segment widely patent. Possible short-segment severe stenosis at the proximal left A1 segment (series 1095, image 11). Normal anterior communicating artery complex. A2 segments patent distally without stenosis. No M1 stenosis or occlusion. Normal MCA bifurcations. Distal MCA branches well perfused and symmetric. POSTERIOR CIRCULATION: Both vertebral arteries patent to the vertebrobasilar junction without stenosis. Right vertebral artery dominant. Right PICA patent. Left PICA not seen. Basilar mildly irregular but is patent to its distal aspect without stenosis. Superior cerebellar arteries patent bilaterally. Both PCA supplied via small bilateral P1 segments as well as robust bilateral posterior communicating arteries. PCAs well perfused to their distal aspects without stenosis. No intracranial aneurysm. MRA NECK FINDINGS AORTIC ARCH: Visualized aortic arch normal in caliber with normal branch pattern. No hemodynamically significant stenosis seen about the origin of the great vessels. RIGHT CAROTID SYSTEM: Right common and internal carotid arteries patent without stenosis, evidence for dissection or occlusion. No significant atheromatous narrowing or irregularity about the right bifurcation. LEFT CAROTID SYSTEM: Left common and internal carotid arteries patent without stenosis, evidence for  dissection, or occlusion. No significant  atheromatous narrowing or irregularity about the left bifurcation. VERTEBRAL ARTERIES: Right vertebral artery strongly dominant and arises from the right subclavian artery. No proximal subclavian artery stenosis. Left vertebral artery diffusely hypoplastic and appears to arise directly from the aortic arch. Origin of the left vertebral artery not well seen on this exam. Visualized portions of the vertebral arteries patent without stenosis, evidence for dissection, or occlusion. IMPRESSION: MRA HEAD: 1. Motion degraded exam. 2. Question short-segment severe stenosis at the proximal left A1 segment. 3. Atherosclerotic irregularity throughout the carotid siphons with no more than mild stenosis bilaterally. 4. Otherwise negative intracranial MRA. No large vessel occlusion. No other hemodynamically significant or correctable stenosis. MRA NECK: 1. Negative MRA of the neck. 2. No hemodynamically significant stenosis about either carotid artery system. 3. Both vertebral arteries widely patent within the neck. Right vertebral artery dominant. Electronically Signed   By: Rise MuBenjamin  McClintock M.D.   On: 04/07/2021 02:40   MR BRAIN WO CONTRAST  Result Date: 04/06/2021 CLINICAL DATA:  Neuro deficit, acute stroke suspected. EXAM: MRI HEAD WITHOUT CONTRAST TECHNIQUE: Multiplanar, multiecho pulse sequences of the brain and surrounding structures were obtained without intravenous contrast. COMPARISON:  None. FINDINGS: Incomplete exam due to patient tolerance. Diffusion imaging (DWI and ADC) and axial T2/FLAIR with obtained. Remaining sequences were not performed. Within this limitation: Brain: No evidence of acute infarction, acute hemorrhage, hydrocephalus, extra-axial collection or mass lesion. Vascular: Major arterial flow voids are maintained at the skull base. Small left vertebral artery, most likely non dominant. Skull and upper cervical spine: Normal marrow signal. Sinuses/Orbits:  Clear sinuses.  Unremarkable orbits. Other: No sizable mastoid effusions. IMPRESSION: Incomplete exam (as detailed above) without acute infarct. Electronically Signed   By: Feliberto HartsFrederick S Jones MD   On: 04/06/2021 20:35   ECHOCARDIOGRAM COMPLETE BUBBLE STUDY  Result Date: 04/07/2021    ECHOCARDIOGRAM REPORT   Patient Name:   Kathleen ShirkYVONNCERIA C Meints Date of Exam: 04/07/2021 Medical Rec #:  [id number]           Height:       66.0 in Accession #:    [id number]          Weight:       179.9 lb Date of Birth:  [birth year]          BSA:          1.912 m Patient Age:    54 years            BP:           159/89 mmHg Patient Gender: F                   HR:           90 bpm. Exam Location:  Inpatient Procedure: 2D Echo, 3D Echo, Color Doppler, Cardiac Doppler, Strain Analysis and            Saline Contrast Bubble Study Indications:    Stroke 434.91 / I63.9  History:        Patient has no prior history of Echocardiogram examinations.                 Risk Factors:Hypertension and Diabetes.  Sonographer:    Leta Junglingiffany Cooper RDCS Referring Phys: [id number] BRADLEY S CHOTINER IMPRESSIONS  1. Left ventricular ejection fraction, by estimation, is 55 to 60%. Left ventricular ejection fraction by 3D volume is 51 %. The left ventricle has normal function. The left ventricle has no regional wall motion abnormalities. There is  mild concentric left ventricular hypertrophy. Left ventricular diastolic parameters are consistent with Grade I diastolic dysfunction (impaired relaxation). The average left ventricular global longitudinal strain is -19.0 %.  2. Right ventricular systolic function is normal. The right ventricular size is normal.  3. The mitral valve is normal in structure. No evidence of mitral valve regurgitation. No evidence of mitral stenosis.  4. The aortic valve is normal in structure. Aortic valve regurgitation is not visualized. No aortic stenosis is present.  5. The inferior vena cava is normal in size with greater than 50% respiratory  variability, suggesting right atrial pressure of 3 mmHg.  6. Agitated saline contrast bubble study was negative, with no evidence of any interatrial shunt. FINDINGS  Left Ventricle: Left ventricular ejection fraction, by estimation, is 55 to 60%. Left ventricular ejection fraction by 3D volume is 51 %. The left ventricle has normal function. The left ventricle has no regional wall motion abnormalities. The average left ventricular global longitudinal strain is -19.0 %. The left ventricular internal cavity size was normal in size. There is mild concentric left ventricular hypertrophy. Left ventricular diastolic parameters are consistent with Grade I diastolic dysfunction (impaired relaxation). Normal left ventricular filling pressure. Right Ventricle: The right ventricular size is normal. No increase in right ventricular wall thickness. Right ventricular systolic function is normal. Left Atrium: Left atrial size was normal in size. Right Atrium: Right atrial size was normal in size. Pericardium: There is no evidence of pericardial effusion. Mitral Valve: The mitral valve is normal in structure. No evidence of mitral valve regurgitation. No evidence of mitral valve stenosis. Tricuspid Valve: The tricuspid valve is normal in structure. Tricuspid valve regurgitation is not demonstrated. No evidence of tricuspid stenosis. Aortic Valve: The aortic valve is normal in structure. Aortic valve regurgitation is not visualized. No aortic stenosis is present. Pulmonic Valve: The pulmonic valve was normal in structure. Pulmonic valve regurgitation is not visualized. No evidence of pulmonic stenosis. Aorta: The aortic root is normal in size and structure. Venous: The inferior vena cava is normal in size with greater than 50% respiratory variability, suggesting right atrial pressure of 3 mmHg. IAS/Shunts: No atrial level shunt detected by color flow Doppler. Agitated saline contrast was given intravenously to evaluate for  intracardiac shunting. Agitated saline contrast bubble study was negative, with no evidence of any interatrial shunt.  LEFT VENTRICLE PLAX 2D LVIDd:         3.50 cm         Diastology LVIDs:         2.60 cm         LV e' medial:    5.35 cm/s LV PW:         1.30 cm         LV E/e' medial:  10.3 LV IVS:        1.20 cm         LV e' lateral:   5.64 cm/s LVOT diam:     1.90 cm         LV E/e' lateral: 9.8 LV SV:         43 LV SV Index:   22              2D LVOT Area:     2.84 cm        Longitudinal  Strain                                2D Strain GLS  -19.0 %                                Avg:                                 3D Volume EF                                LV 3D EF:    Left                                             ventricular                                             ejection                                             fraction by                                             3D volume                                             is 51 %.                                 3D Volume EF:                                3D EF:        51 %                                LV EDV:       103 ml                                LV ESV:       50 ml                                LV SV:        52 ml RIGHT VENTRICLE RV S prime:     13.10 cm/s TAPSE (M-mode): 1.6 cm LEFT ATRIUM  Index       RIGHT ATRIUM          Index LA diam:        2.70 cm 1.41 cm/m  RA Area:     7.99 cm LA Vol (A2C):   23.6 ml 12.34 ml/m RA Volume:   11.90 ml 6.22 ml/m LA Vol (A4C):   24.7 ml 12.92 ml/m LA Biplane Vol: 24.3 ml 12.71 ml/m  AORTIC VALVE LVOT Vmax:   73.00 cm/s LVOT Vmean:  48.100 cm/s LVOT VTI:    0.150 m  AORTA Ao Root diam: 3.10 cm Ao Asc diam:  3.10 cm MITRAL VALVE MV Area (PHT): 4.10 cm    SHUNTS MV Decel Time: 185 msec    Systemic VTI:  0.15 m MV E velocity: 55.00 cm/s  Systemic Diam: 1.90 cm MV A velocity: 82.00 cm/s MV E/A ratio:  0.67 Mihai Croitoru MD Electronically signed by Thurmon Fair MD Signature Date/Time: 04/07/2021/11:10:19 AM    Final     PHYSICAL EXAM General: Pleasant middle-aged African-American lady appears well-developed; no acute distress Psych: Affect appropriate to situation Eyes: No scleral injection HENT: No OP obstrucion Head: Normocephalic.  Cardiovascular: Normal rate and regular rhythm.  Respiratory: Effort normal and breath sounds normal to anterior ascultation GI: Soft.  No distension. There is no tenderness.  Skin: WDI    Neurological Examination Mental Status: Alert, oriented, thought content appropriate.  Speech fluent without evidence of aphasia. Able to follow 3 step commands without difficulty. Cranial Nerves: II: Visual fields grossly normal,  III,IV, VI: ptosis not present, extra-ocular motions intact bilaterally, pupils equal, round, reactive to light and accommodation V,VII: smile symmetric, facial light touch sensation normal bilaterally VIII: hearing normal bilaterally IX,X: uvula rises symmetrically XI: bilateral shoulder shrug XII: midline tongue extension Motor: Right : Upper extremity   5/5    Left:     Upper extremity   5/5  Lower extremity   5/5     Lower extremity   5/5 Tone and bulk:normal tone throughout; no atrophy noted Sensory: Pinprick and light touch intact throughout, bilaterally Deep Tendon Reflexes: 2+ and symmetric throughout Plantars: Right: downgoing   Left: downgoing Cerebellar: normal finger-to-nose, normal rapid alternating movements and normal heel-to-shin test Gait: normal gait and station  ASSESSMENT/PLAN Ms. Kathleen Mcmillan is a 55 y.o. female with history of diabetes and hypertension, recent recurrent episodes of transient left-sided paresthesias, presents with paresthesias in the left face/arm/leg without other deficits.  Last known well was the night PTA.  Patient was not a tPA candidate due to presenting outside the window. Exam is not consistent with LVO and have resolved.   TIA:  small vessel disease source  CT head No acute abnormality.   MRI  No acute finding  MRA  Small vessel disease, some intracranial stenosis, irregular athero in bilat carotid siphins  2D Echo pending  LDL 179  HgbA1c No results found for requested labs within last 89211 hours.  VTE prophylaxis - freely ambulating now    Diet   Diet heart healthy/carb modified Room service appropriate? Yes; Fluid consistency: Thin     none prior to admission, now on DAPT x3 weeks, then ASA alone  Therapy recommendations:  pending  Disposition:  pending  Hypertension  Home meds:  Hydrodiurinil 25mg  po  Stable . Permissive hypertension (OK if < 220/120) but gradually normalize in 5-7 days . Long-term BP goal normotensive  Hyperlipidemia  Home meds: none  LDL 179, goal < 70  Add lipitor  qhs po   High intensity statin   Continue statin at discharge  Diabetes type II   Home meds: glucophage  HgbA1c No results found for requested labs within last 40981 hours., goal < 7.0  CBGs Recent Labs    04/07/21 0118 04/07/21 0913 04/07/21 1148  GLUCAP 247* 357* 348*      SSI  Other Stroke Risk Factors Cigarette smoker; advised to stop smoking   Hospital day # 0  Desiree Metzger-Cihelka, ARNP-C, ANVP-BC Pager: [phone] I have personally obtained history,examined this patient, reviewed notes, independently viewed imaging studies, participated in medical decision making and plan of care.ROS completed by me personally and pertinent positives fully documented  I have made any additions or clarifications directly to the above note. Agree with note above.  She presented with transient left face and upper extremity paresthesia likely from right subcortical TIA from small vessel disease.  Recommend aspirin Plavix for 3 weeks followed by aspirin alone.  Check echocardiogram and carotid ultrasound.  Statin for elevated lipids.  Long discussion with the patient about stroke  prevention and treatment and answering questions.  Discussed with Dr. Waymon Amato .  Greater than 50% time during this 25-minute visit was spent on counseling and coordination of care about TIA and stroke prevention and answering questions.  Follow-up as an outpatient stroke clinic in 6 weeks  Delia Heady, MD Medical Director [facility] Pager: [phone] 04/07/2021 4:05 PM  To contact Stroke Continuity provider, please refer to [email or web address]. After hours, contact General Neurology
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
## 2021-04-07 NOTE — Progress Notes (Signed)
PT Cancellation Note  Patient Details Name: Kathleen Mcmillan MRN: [id number] DOB: May 17, 1966   Cancelled Treatment:    Reason Eval/Treat Not Completed: PT screened, no needs identified, will sign off Spoke with OT who reports pt with no skilled PT needs at this time. OT reports she plans to sign off as well. Please re-consult if needs change.   Cindee Salt, DPT  Acute Rehabilitation Services  Pager: [phone] Office: [phone]    Lehman Prom 04/07/2021, 9:44 AM

## 2021-04-07 NOTE — Care Management (Signed)
ED RNCM received consult from EDP to assist transitional care planning.  Reviewed patient's record and noted an admission consult with Hospitalist.

## 2021-04-07 NOTE — ED Notes (Signed)
Got patient a tooth brush patient is sitting bedside the bed with call bell in reach

## 2021-04-08 LAB — HEMOGLOBIN A1C
Hgb A1c MFr Bld: 14.5 % — ABNORMAL HIGH (ref 4.8–5.6)
Mean Plasma Glucose: 369 mg/dL

## 2021-04-09 ENCOUNTER — Ambulatory Visit (INDEPENDENT_AMBULATORY_CARE_PROVIDER_SITE_OTHER): Payer: Medicaid Other | Admitting: Nurse Practitioner

## 2021-04-09 ENCOUNTER — Telehealth: Payer: Self-pay

## 2021-04-09 VITALS — BP 146/98 | HR 111 | Temp 97.9°F | Resp 18

## 2021-04-09 DIAGNOSIS — G459 Transient cerebral ischemic attack, unspecified: Secondary | ICD-10-CM

## 2021-04-09 DIAGNOSIS — E1165 Type 2 diabetes mellitus with hyperglycemia: Secondary | ICD-10-CM

## 2021-04-09 DIAGNOSIS — E785 Hyperlipidemia, unspecified: Secondary | ICD-10-CM

## 2021-04-09 DIAGNOSIS — I10 Essential (primary) hypertension: Secondary | ICD-10-CM
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
## 2021-04-09 NOTE — Patient Instructions (Addendum)
TIA:   Neurology referral has been placed - [phone]  Refuses to take plavix  Currently taking aspirin   Poorly controlled type II DM with hyperglycemia:   HGB A1C currently 14.5  Currently prescribed Glimepiride  Will need labs checked   Uncontrolled hypertension/hypertensive emergency:   Started lisinopril 10 Mg daily at hospital D/C - compliant  BMP will need to be monitor   Hyperlipidemia:   LDL 179, direct LDL 227.  High intensity statin, atorvastatin 40 mg daily initiated.    Recommend checking fasting lipids and LFTs in 4 to 6 weeks.  Follow up:  Will need an appointment with [facility] to establish care with new PCP - [phone]  Will need an appointment with [facility] (pharmacy) for medication counseling - patient refuses to take medications 

## 2021-04-09 NOTE — Telephone Encounter (Signed)
Call placed to patient at request of Angus Seller, NP. Patient has been refusing to take her medications except for the lisinopril and she could benefit from meeting with Butch Penny, [facility] for medication management.  The patient said she may be out of town in the upcoming weeks but was agreeable to scheduling an appointment for 04/27/2021. Provided her with the address for [facility].  She said she will call if she needs to cancel

## 2021-04-09 NOTE — Assessment & Plan Note (Signed)
TIA:   Neurology referral has been placed - [phone]  Refuses to take plavix  Currently taking aspirin   Poorly controlled type II DM with hyperglycemia:   HGB A1C currently 14.5  Currently prescribed Glimepiride  Will need labs checked   Uncontrolled hypertension/hypertensive emergency:   Started lisinopril 10 Mg daily at hospital D/C - compliant  BMP will need to be monitor   Hyperlipidemia:   LDL 179, direct LDL 227.  High intensity statin, atorvastatin 40 mg daily initiated.    Recommend checking fasting lipids and LFTs in 4 to 6 weeks.  Follow up:  Will need an appointment with [facility] to establish care with new PCP - [phone]  Will need an appointment with [facility] (pharmacy) for medication counseling - patient refuses to take medications

## 2021-04-10 LAB — CBC
Hematocrit: 44.5 % (ref 34.0–46.6)
Hemoglobin: 13.5 g/dL (ref 11.1–15.9)
MCH: 22.8 pg — ABNORMAL LOW (ref 26.6–33.0)
MCHC: 30.3 g/dL — ABNORMAL LOW (ref 31.5–35.7)
MCV: 75 fL — ABNORMAL LOW (ref 79–97)
Platelets: 337 10*3/uL (ref 150–450)
RBC: 5.93 x10E6/uL — ABNORMAL HIGH (ref 3.77–5.28)
RDW: 13.2 % (ref 11.7–15.4)
WBC: 7.1 10*3/uL (ref 3.4–10.8)

## 2021-04-10 LAB — COMPREHENSIVE METABOLIC PANEL
ALT: 14 IU/L (ref 0–32)
AST: 12 IU/L (ref 0–40)
Albumin/Globulin Ratio: 1.6 (ref 1.2–2.2)
Albumin: 4.4 g/dL (ref 3.8–4.9)
Alkaline Phosphatase: 119 IU/L (ref 44–121)
BUN/Creatinine Ratio: 17 (ref 9–23)
BUN: 19 mg/dL (ref 6–24)
Bilirubin Total: 0.7 mg/dL (ref 0.0–1.2)
CO2: 25 mmol/L (ref 20–29)
Calcium: 10.3 mg/dL — ABNORMAL HIGH (ref 8.7–10.2)
Chloride: 96 mmol/L (ref 96–106)
Creatinine, Ser: 1.09 mg/dL — ABNORMAL HIGH (ref 0.57–1.00)
Globulin, Total: 2.8 g/dL (ref 1.5–4.5)
Glucose: 357 mg/dL — ABNORMAL HIGH (ref 65–99)
Potassium: 5.1 mmol/L (ref 3.5–5.2)
Sodium: 138 mmol/L (ref 134–144)
Total Protein: 7.2 g/dL (ref 6.0–8.5)
eGFR: 60 mL/min/{1.73_m2} (ref >59)

## 2021-04-15 ENCOUNTER — Other Ambulatory Visit (HOSPITAL_COMMUNITY): Payer: Self-pay

## 2021-04-16 ENCOUNTER — Other Ambulatory Visit (HOSPITAL_COMMUNITY): Payer: Self-pay

## 2021-04-16 ENCOUNTER — Telehealth (HOSPITAL_COMMUNITY): Payer: Self-pay

## 2021-04-16 NOTE — Telephone Encounter (Signed)
Transitions of Care Pharmacy   Call attempted for a pharmacy transitions of care follow-up. HIPAA appropriate voicemail was left with call back information provided.   Call attempt #2. Will follow-up in 2-3 days.    

## 2021-04-17 ENCOUNTER — Other Ambulatory Visit (HOSPITAL_COMMUNITY): Payer: Self-pay

## 2021-04-17 ENCOUNTER — Telehealth (HOSPITAL_COMMUNITY): Payer: Self-pay | Admitting: Pharmacist

## 2021-04-17 NOTE — Telephone Encounter (Signed)
Pharmacy Transitions of Care Follow-up Telephone Call  Date of discharge: 04/07/21   How have you been since you were released from the hospital? Fine   Medication changes made at discharge:  - START: Clopidogrel, atorvastatin, asa, lisinopril  Medication changes verified by the patient? Yes (Yes/No)    Medication Accessibility:  [facility]: [facility]   Was the patient provided with refills on discharged medications? Yes   Have all prescriptions been transferred from [facility] to home pharmacy? Yes   Is the patient able to afford medications? Yes     Medication Review:  CLOPIDOGREL (PLAVIX) Clopidogrel 75 mg once daily.  - Educated patient on expected duration of therapy of  with clopidogrel. Advised patient that aspirin will be continued indefinitely.  - Reviewed potential DDIs with patient  - Advised patient of medications to avoid (NSAIDs, ASA)  - Educated that Tylenol (acetaminophen) will be the preferred analgesic to prevent risk of bleeding  - Emphasized importance of monitoring for signs and symptoms of bleeding (abnormal bruising, prolonged bleeding, nose bleeds, bleeding from gums, discolored urine, black tarry stools)  - Advised patient to alert all providers of anticoagulation therapy prior to starting a new medication or having a procedure    Follow-up Appointments:  [facility]t confirmed? Yes  If their condition worsens, is the pt aware to call PCP or go to the Emergency Dept.? Yes  Final Patient Assessment: Patient reports she has been doing fine since discharge.  Reviewed medications and transferred medications to preferred pharmacy.

## 2021-04-27 ENCOUNTER — Ambulatory Visit: Payer: Medicaid Other | Admitting: Pharmacist

## 2021-05-11 ENCOUNTER — Inpatient Hospital Stay (INDEPENDENT_AMBULATORY_CARE_PROVIDER_SITE_OTHER): Payer: Medicaid Other | Admitting: Primary Care

## 2021-05-21 ENCOUNTER — Ambulatory Visit: Payer: Self-pay | Admitting: Nurse Practitioner

## 2021-07-13 ENCOUNTER — Encounter: Payer: Self-pay | Admitting: Nurse Practitioner

## 2021-07-13 ENCOUNTER — Other Ambulatory Visit: Payer: Self-pay

## 2021-07-13 ENCOUNTER — Ambulatory Visit (INDEPENDENT_AMBULATORY_CARE_PROVIDER_SITE_OTHER): Payer: Self-pay | Admitting: Nurse Practitioner

## 2021-07-13 VITALS — BP 185/102 | HR 101 | Temp 97.2°F | Ht 65.5 in | Wt 179.0 lb

## 2021-07-13 DIAGNOSIS — I10 Essential (primary) hypertension: Secondary | ICD-10-CM

## 2021-07-13 DIAGNOSIS — I160 Hypertensive urgency: Secondary | ICD-10-CM

## 2021-07-13 DIAGNOSIS — E1165 Type 2 diabetes mellitus with hyperglycemia: Secondary | ICD-10-CM

## 2021-07-13 DIAGNOSIS — W57XXXA Bitten or stung by nonvenomous insect and other nonvenomous arthropods, initial encounter: Secondary | ICD-10-CM

## 2021-07-13 LAB — POCT URINALYSIS DIP (CLINITEK)
Bilirubin, UA: NEGATIVE
Glucose, UA: >=1000 mg/dL — AB
Ketones, POC UA: NEGATIVE mg/dL
Leukocytes, UA: NEGATIVE
Nitrite, UA: NEGATIVE
POC PROTEIN,UA: NEGATIVE
Spec Grav, UA: 1.02 (ref 1.010–1.025)
Urobilinogen, UA: 0.2 E.U./dL
pH, UA: 7 (ref 5.0–8.0)

## 2021-07-13 LAB — POCT GLYCOSYLATED HEMOGLOBIN (HGB A1C)
HbA1c POC (<> result, manual entry): 13.9 % (ref 4.0–5.6)
HbA1c, POC (controlled diabetic range): 13.9 % — AB (ref 0.0–7.0)
HbA1c, POC (prediabetic range): 13.9 % — AB (ref 5.7–6.4)
Hemoglobin A1C: 13.9 % — AB (ref 4.0–5.6)

## 2021-07-13 MED ORDER — CLONIDINE HCL 0.1 MG PO TABS: 0.1000 mg | ORAL_TABLET | Freq: Once | ORAL | Status: DC

## 2021-07-13 NOTE — Patient Instructions (Signed)
You were seen today in the [facility] for diabetes and hypertension. Labs were collected, results will be available via MyChart or, if abnormal, you will be contacted by clinic staff. Please take your previously prescribed medications as directed. Please follow up in 30 days for reevaluation.
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
## 2021-08-05 ENCOUNTER — Ambulatory Visit: Payer: Self-pay | Admitting: Nurse Practitioner

## 2021-08-12 ENCOUNTER — Ambulatory Visit: Payer: Self-pay | Admitting: Nurse Practitioner

## 2021-08-26 ENCOUNTER — Emergency Department (HOSPITAL_BASED_OUTPATIENT_CLINIC_OR_DEPARTMENT_OTHER)
Admission: EM | Admit: 2021-08-26 | Discharge: 2021-08-26 | Disposition: A | Discharge status: Home / Self Care | Payer: Medicaid Other | Attending: Emergency Medicine | Admitting: Emergency Medicine

## 2021-08-26 ENCOUNTER — Encounter (HOSPITAL_BASED_OUTPATIENT_CLINIC_OR_DEPARTMENT_OTHER): Payer: Self-pay | Admitting: Emergency Medicine

## 2021-08-26 ENCOUNTER — Other Ambulatory Visit: Payer: Self-pay

## 2021-08-26 DIAGNOSIS — Z87891 Personal history of nicotine dependence: Secondary | ICD-10-CM | POA: Insufficient documentation

## 2021-08-26 DIAGNOSIS — E119 Type 2 diabetes mellitus without complications: Secondary | ICD-10-CM | POA: Insufficient documentation

## 2021-08-26 DIAGNOSIS — Z7982 Long term (current) use of aspirin: Secondary | ICD-10-CM | POA: Insufficient documentation

## 2021-08-26 DIAGNOSIS — Z79899 Other long term (current) drug therapy: Secondary | ICD-10-CM | POA: Insufficient documentation

## 2021-08-26 DIAGNOSIS — I10 Essential (primary) hypertension: Secondary | ICD-10-CM | POA: Insufficient documentation

## 2021-08-26 DIAGNOSIS — R030 Elevated blood-pressure reading, without diagnosis of hypertension: Secondary | ICD-10-CM

## 2021-08-26 DIAGNOSIS — Z7984 Long term (current) use of oral hypoglycemic drugs: Secondary | ICD-10-CM | POA: Insufficient documentation

## 2021-08-26 DIAGNOSIS — Z7902 Long term (current) use of antithrombotics/antiplatelets: Secondary | ICD-10-CM | POA: Insufficient documentation

## 2021-08-26 LAB — COMPREHENSIVE METABOLIC PANEL
ALT: 14 U/L (ref 0–44)
AST: 12 U/L — ABNORMAL LOW (ref 15–41)
Albumin: 3.6 g/dL (ref 3.5–5.0)
Alkaline Phosphatase: 115 U/L (ref 38–126)
Anion gap: 8 (ref 5–15)
BUN: 12 mg/dL (ref 6–20)
CO2: 26 mmol/L (ref 22–32)
Calcium: 8.8 mg/dL — ABNORMAL LOW (ref 8.9–10.3)
Chloride: 100 mmol/L (ref 98–111)
Creatinine, Ser: 0.78 mg/dL (ref 0.44–1.00)
GFR, Estimated: >60 mL/min (ref >60)
Glucose, Bld: 386 mg/dL — ABNORMAL HIGH (ref 70–99)
Potassium: 3.9 mmol/L (ref 3.5–5.1)
Sodium: 134 mmol/L — ABNORMAL LOW (ref 135–145)
Total Bilirubin: 0.4 mg/dL (ref 0.3–1.2)
Total Protein: 7.9 g/dL (ref 6.5–8.1)

## 2021-08-26 LAB — CBC WITH DIFFERENTIAL/PLATELET
Abs Immature Granulocytes: 0.03 10*3/uL (ref 0.00–0.07)
Basophils Absolute: 0 10*3/uL (ref 0.0–0.1)
Basophils Relative: 0 %
Eosinophils Absolute: 0 10*3/uL (ref 0.0–0.5)
Eosinophils Relative: 1 %
HCT: 43 % (ref 36.0–46.0)
Hemoglobin: 13.3 g/dL (ref 12.0–15.0)
Immature Granulocytes: 0 %
Lymphocytes Relative: 20 %
Lymphs Abs: 1.5 10*3/uL (ref 0.7–4.0)
MCH: 22.9 pg — ABNORMAL LOW (ref 26.0–34.0)
MCHC: 30.9 g/dL (ref 30.0–36.0)
MCV: 74 fL — ABNORMAL LOW (ref 80.0–100.0)
Monocytes Absolute: 0.5 10*3/uL (ref 0.1–1.0)
Monocytes Relative: 6 %
Neutro Abs: 5.3 10*3/uL (ref 1.7–7.7)
Neutrophils Relative %: 73 %
Platelets: 268 10*3/uL (ref 150–400)
RBC: 5.81 MIL/uL — ABNORMAL HIGH (ref 3.87–5.11)
RDW: 13.9 % (ref 11.5–15.5)
WBC: 7.3 10*3/uL (ref 4.0–10.5)
nRBC: 0 % (ref 0.0–0.2)

## 2021-08-26 MED ORDER — HYDROCHLOROTHIAZIDE 25 MG PO TABS
25.0000 mg | ORAL_TABLET | Freq: Once | ORAL | Status: AC
  Administered 2021-08-26: 25 mg via ORAL
  Filled 2021-08-26: qty 1

## 2021-08-26 MED ORDER — ACETAMINOPHEN 500 MG PO TABS
1000.0000 mg | ORAL_TABLET | Freq: Once | ORAL | Status: AC
  Administered 2021-08-26: 1000 mg via ORAL
  Filled 2021-08-26: qty 2

## 2021-08-26 MED ORDER — HYDROCHLOROTHIAZIDE 25 MG PO TABS: 25.0000 mg | ORAL_TABLET | Freq: Every day | ORAL | 0 refills | Status: DC

## 2021-08-26 NOTE — ED Triage Notes (Addendum)
Pt states she is not feeling well tonight and so she checked her blood pressure at home and it was elevated  Pt states she could not find her blood pressure medication  Pt states earlier she had blurred vision but that is resolving  Pt is also c/o her feet   Pt states she has not taken her blood pressure since last week
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
## 2021-08-31 ENCOUNTER — Telehealth: Payer: Self-pay

## 2021-08-31 NOTE — Telephone Encounter (Signed)
[facility] [address]

## 2021-09-01 ENCOUNTER — Other Ambulatory Visit: Payer: Self-pay | Admitting: Nurse Practitioner

## 2021-09-01 DIAGNOSIS — E1165 Type 2 diabetes mellitus with hyperglycemia: Secondary | ICD-10-CM

## 2021-09-01 MED ORDER — GLIMEPIRIDE 2 MG PO TABS: 2.0000 mg | ORAL_TABLET | ORAL | 2 refills | Status: DC

## 2021-12-20 ENCOUNTER — Other Ambulatory Visit: Payer: Self-pay

## 2021-12-20 ENCOUNTER — Encounter (HOSPITAL_BASED_OUTPATIENT_CLINIC_OR_DEPARTMENT_OTHER): Payer: Self-pay | Admitting: Emergency Medicine

## 2021-12-20 ENCOUNTER — Emergency Department (HOSPITAL_BASED_OUTPATIENT_CLINIC_OR_DEPARTMENT_OTHER)
Admission: EM | Admit: 2021-12-20 | Discharge: 2021-12-20 | Disposition: A | Discharge status: Home / Self Care | Payer: Medicaid Other | Attending: Emergency Medicine | Admitting: Emergency Medicine

## 2021-12-20 DIAGNOSIS — E1140 Type 2 diabetes mellitus with diabetic neuropathy, unspecified: Secondary | ICD-10-CM | POA: Insufficient documentation

## 2021-12-20 DIAGNOSIS — G629 Polyneuropathy, unspecified: Secondary | ICD-10-CM

## 2021-12-20 DIAGNOSIS — E1165 Type 2 diabetes mellitus with hyperglycemia: Secondary | ICD-10-CM | POA: Insufficient documentation

## 2021-12-20 DIAGNOSIS — I10 Essential (primary) hypertension: Secondary | ICD-10-CM | POA: Insufficient documentation

## 2021-12-20 DIAGNOSIS — Z7902 Long term (current) use of antithrombotics/antiplatelets: Secondary | ICD-10-CM | POA: Insufficient documentation

## 2021-12-20 DIAGNOSIS — Z7982 Long term (current) use of aspirin: Secondary | ICD-10-CM | POA: Insufficient documentation

## 2021-12-20 DIAGNOSIS — R739 Hyperglycemia, unspecified: Secondary | ICD-10-CM

## 2021-12-20 LAB — BASIC METABOLIC PANEL
Anion gap: 7 (ref 5–15)
BUN: 20 mg/dL (ref 6–20)
CO2: 28 mmol/L (ref 22–32)
Calcium: 8.9 mg/dL (ref 8.9–10.3)
Chloride: 97 mmol/L — ABNORMAL LOW (ref 98–111)
Creatinine, Ser: 1.12 mg/dL — ABNORMAL HIGH (ref 0.44–1.00)
GFR, Estimated: 57 mL/min — ABNORMAL LOW (ref >60)
Glucose, Bld: 422 mg/dL — ABNORMAL HIGH (ref 70–99)
Potassium: 3.7 mmol/L (ref 3.5–5.1)
Sodium: 132 mmol/L — ABNORMAL LOW (ref 135–145)

## 2021-12-20 LAB — CBC
HCT: 37 % (ref 36.0–46.0)
Hemoglobin: 11.4 g/dL — ABNORMAL LOW (ref 12.0–15.0)
MCH: 22.9 pg — ABNORMAL LOW (ref 26.0–34.0)
MCHC: 30.8 g/dL (ref 30.0–36.0)
MCV: 74.3 fL — ABNORMAL LOW (ref 80.0–100.0)
Platelets: 277 10*3/uL (ref 150–400)
RBC: 4.98 MIL/uL (ref 3.87–5.11)
RDW: 14.4 % (ref 11.5–15.5)
WBC: 6.8 10*3/uL (ref 4.0–10.5)
nRBC: 0 % (ref 0.0–0.2)

## 2021-12-20 LAB — CBG MONITORING, ED: Glucose-Capillary: 270 mg/dL — ABNORMAL HIGH (ref 70–99)

## 2021-12-20 MED ORDER — INSULIN ASPART 100 UNIT/ML IJ SOLN
8.0000 [IU] | Freq: Once | INTRAMUSCULAR | Status: AC
  Administered 2021-12-20: 8 [IU] via INTRAVENOUS

## 2021-12-20 MED ORDER — SODIUM CHLORIDE 0.9 % IV BOLUS
1000.0000 mL | Freq: Once | INTRAVENOUS | Status: AC
  Administered 2021-12-20: 1000 mL via INTRAVENOUS

## 2021-12-20 NOTE — ED Notes (Signed)
ED Provider at bedside. 
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
## 2022-06-12 ENCOUNTER — Ambulatory Visit (HOSPITAL_COMMUNITY)
Admission: EM | Admit: 2022-06-12 | Discharge: 2022-06-12 | Disposition: A | Discharge status: Home / Self Care | Payer: Self-pay | Attending: Physician Assistant | Admitting: Physician Assistant

## 2022-06-12 ENCOUNTER — Encounter (HOSPITAL_COMMUNITY): Payer: Self-pay | Admitting: Emergency Medicine

## 2022-06-12 ENCOUNTER — Emergency Department (HOSPITAL_COMMUNITY): Payer: Self-pay

## 2022-06-12 ENCOUNTER — Other Ambulatory Visit: Payer: Self-pay

## 2022-06-12 ENCOUNTER — Ambulatory Visit (INDEPENDENT_AMBULATORY_CARE_PROVIDER_SITE_OTHER): Payer: Self-pay

## 2022-06-12 ENCOUNTER — Emergency Department (HOSPITAL_COMMUNITY)
Admission: EM | Admit: 2022-06-12 | Discharge: 2022-06-13 | Disposition: A | Discharge status: Home / Self Care | Payer: Self-pay | Attending: Emergency Medicine | Admitting: Emergency Medicine

## 2022-06-12 DIAGNOSIS — S91101A Unspecified open wound of right great toe without damage to nail, initial encounter: Secondary | ICD-10-CM

## 2022-06-12 DIAGNOSIS — Z7984 Long term (current) use of oral hypoglycemic drugs: Secondary | ICD-10-CM | POA: Insufficient documentation

## 2022-06-12 DIAGNOSIS — E11628 Type 2 diabetes mellitus with other skin complications: Secondary | ICD-10-CM

## 2022-06-12 DIAGNOSIS — Z7982 Long term (current) use of aspirin: Secondary | ICD-10-CM | POA: Insufficient documentation

## 2022-06-12 DIAGNOSIS — I10 Essential (primary) hypertension: Secondary | ICD-10-CM | POA: Insufficient documentation

## 2022-06-12 DIAGNOSIS — W57XXXA Bitten or stung by nonvenomous insect and other nonvenomous arthropods, initial encounter: Secondary | ICD-10-CM

## 2022-06-12 DIAGNOSIS — L089 Local infection of the skin and subcutaneous tissue, unspecified: Secondary | ICD-10-CM | POA: Insufficient documentation

## 2022-06-12 DIAGNOSIS — S80861A Insect bite (nonvenomous), right lower leg, initial encounter: Secondary | ICD-10-CM

## 2022-06-12 DIAGNOSIS — E1165 Type 2 diabetes mellitus with hyperglycemia: Secondary | ICD-10-CM

## 2022-06-12 DIAGNOSIS — R000 Tachycardia, unspecified: Secondary | ICD-10-CM | POA: Insufficient documentation

## 2022-06-12 DIAGNOSIS — B354 Tinea corporis: Secondary | ICD-10-CM

## 2022-06-12 LAB — PROTIME-INR
INR: 0.9 (ref 0.8–1.2)
Prothrombin Time: 12.3 seconds (ref 11.4–15.2)

## 2022-06-12 LAB — CBC WITH DIFFERENTIAL/PLATELET
Abs Immature Granulocytes: 0.02 10*3/uL (ref 0.00–0.07)
Basophils Absolute: 0 10*3/uL (ref 0.0–0.1)
Basophils Relative: 0 %
Eosinophils Absolute: 0.1 10*3/uL (ref 0.0–0.5)
Eosinophils Relative: 1 %
HCT: 37.4 % (ref 36.0–46.0)
Hemoglobin: 11.5 g/dL — ABNORMAL LOW (ref 12.0–15.0)
Immature Granulocytes: 0 %
Lymphocytes Relative: 31 %
Lymphs Abs: 2.1 10*3/uL (ref 0.7–4.0)
MCH: 23.1 pg — ABNORMAL LOW (ref 26.0–34.0)
MCHC: 30.7 g/dL (ref 30.0–36.0)
MCV: 75.1 fL — ABNORMAL LOW (ref 80.0–100.0)
Monocytes Absolute: 0.8 10*3/uL (ref 0.1–1.0)
Monocytes Relative: 11 %
Neutro Abs: 3.9 10*3/uL (ref 1.7–7.7)
Neutrophils Relative %: 57 %
Platelets: 327 10*3/uL (ref 150–400)
RBC: 4.98 MIL/uL (ref 3.87–5.11)
RDW: 14.2 % (ref 11.5–15.5)
WBC: 6.9 10*3/uL (ref 4.0–10.5)
nRBC: 0 % (ref 0.0–0.2)

## 2022-06-12 LAB — BASIC METABOLIC PANEL
Anion gap: 8 (ref 5–15)
BUN: 20 mg/dL (ref 6–20)
CO2: 27 mmol/L (ref 22–32)
Calcium: 9.2 mg/dL (ref 8.9–10.3)
Chloride: 102 mmol/L (ref 98–111)
Creatinine, Ser: 0.96 mg/dL (ref 0.44–1.00)
GFR, Estimated: >60 mL/min (ref >60)
Glucose, Bld: 197 mg/dL — ABNORMAL HIGH (ref 70–99)
Potassium: 3.7 mmol/L (ref 3.5–5.1)
Sodium: 137 mmol/L (ref 135–145)

## 2022-06-12 LAB — CBG MONITORING, ED: Glucose-Capillary: 196 mg/dL — ABNORMAL HIGH (ref 70–99)

## 2022-06-12 LAB — APTT: aPTT: 25 seconds (ref 24–36)

## 2022-06-12 LAB — SEDIMENTATION RATE: Sed Rate: 33 mm/hr — ABNORMAL HIGH (ref 0–22)

## 2022-06-12 MED ORDER — CLOTRIMAZOLE 1 % EX CREA: 1.0000 | TOPICAL_CREAM | Freq: Two times a day (BID) | CUTANEOUS | 0 refills | Status: AC

## 2022-06-12 MED ORDER — PIPERACILLIN-TAZOBACTAM 3.375 G IVPB: 3.3750 g | INTRAVENOUS | Status: DC

## 2022-06-12 MED ORDER — CEFDINIR 300 MG PO CAPS: 300.0000 mg | ORAL_CAPSULE | Freq: Two times a day (BID) | ORAL | 0 refills | Status: AC

## 2022-06-12 MED ORDER — PIPERACILLIN-TAZOBACTAM 3.375 G IVPB 30 MIN
3.3750 g | Freq: Once | INTRAVENOUS | Status: AC
  Administered 2022-06-12: 3.375 g via INTRAVENOUS
  Filled 2022-06-12: qty 50

## 2022-06-12 MED ORDER — SULFAMETHOXAZOLE-TRIMETHOPRIM 800-160 MG PO TABS
1.0000 | ORAL_TABLET | Freq: Two times a day (BID) | ORAL | 0 refills | Status: DC
  Filled 2022-06-12: qty 14, 7d supply, fill #0

## 2022-06-12 MED ORDER — CEFDINIR 300 MG PO CAPS
300.0000 mg | ORAL_CAPSULE | Freq: Two times a day (BID) | ORAL | 0 refills | Status: DC
  Filled 2022-06-12: qty 14, 7d supply, fill #0

## 2022-06-12 MED ORDER — LINEZOLID 600 MG/300ML IV SOLN
600.0000 mg | INTRAVENOUS | Status: AC
  Administered 2022-06-12: 600 mg via INTRAVENOUS
  Filled 2022-06-12: qty 300

## 2022-06-12 MED ORDER — SULFAMETHOXAZOLE-TRIMETHOPRIM 800-160 MG PO TABS: 1.0000 | ORAL_TABLET | Freq: Two times a day (BID) | ORAL | 0 refills | Status: AC
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
## 2022-06-12 NOTE — ED Provider Triage Note (Signed)
Emergency Medicine Provider Triage Evaluation Note  Kathleen Mcmillan , a 56 y.o. female with Hx of DMT2 was evaluated in triage.  Pt complains of injury to the right great toe.  Might of bumped it 2 days ago, thought it was just a callus.  Noticed a little bit of blood, put a Band-Aid on it, believes she did give it enough air.  Started noticing redness traveling from that toe towards her foot and worsening appearance of the wound.  Denies fevers or chills  Review of Systems  Positive:  Negative: See above  Physical Exam  There were no vitals taken for this visit. Gen:   Awake, no distress   Resp:  Normal effort  MSK:   Moves extremities without difficulty  Other:  Wounds of the left great toe without significant tenderness.  Mild erythema of the surrounding area.  CRT less than 2 seconds.  Medical Decision Making  Medically screening exam initiated at 3:55 PM.  Appropriate orders placed.  Kathleen Mcmillan was informed that the remainder of the evaluation will be completed by another provider, this initial triage assessment does not replace that evaluation, and the importance of remaining in the ED until their evaluation is complete.     Cecil Cobbs, PA-C 06/12/22 1558

## 2022-06-12 NOTE — ED Triage Notes (Signed)
Pt here for fire ant bites to legs and arm that happened on Friday; pt sts right great toe pain and possible callus

## 2022-06-12 NOTE — ED Triage Notes (Signed)
Pt sent over from [facility] for evaluation of her R big toe for pain, and callus/wound to the bottom of the toe. Pt is a diabetic. AOX4. Denies fevers/chills. AOX4.

## 2022-06-12 NOTE — Discharge Instructions (Signed)
Given the rapid progression of the wound on your right great toe and your history of uncontrolled diabetes, it is advised that you present to [facility] emergency department at this time for further evaluation.  I have sent clotrimazole cream to your pharmacy for the possible fungal infection on your body.  It looks like the ant bites are resolving and I would just continue to monitor these areas at this time.  Thank you for coming in today, take care.
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
## 2022-06-12 NOTE — ED Notes (Signed)
Sed rate added on

## 2022-06-12 NOTE — Discharge Instructions (Addendum)
Today you were seen in the emergency department for your foot infection.    In the emergency department you received IV antibiotics and had lab work that was reassuring.    At home, please take the antibiotics we have prescribed you to treat your infection.  Please pick them up tomorrow morning.    Follow-up with podiatry on Monday to have your foot checked.  Follow-up with your primary doctor in 2-3 days regarding your visit.    Return immediately to the emergency department if you experience any of the following: Fevers, chills, worsening pain of your foot, or any other concerning symptoms.    Thank you for visiting our Emergency Department. It was a pleasure taking care of you today.

## 2022-06-14 ENCOUNTER — Other Ambulatory Visit (HOSPITAL_COMMUNITY): Payer: Self-pay

## 2022-06-24 ENCOUNTER — Telehealth (HOSPITAL_COMMUNITY): Payer: Self-pay | Admitting: Emergency Medicine

## 2022-06-24 ENCOUNTER — Ambulatory Visit: Payer: Medicaid Other | Admitting: Podiatry

## 2022-06-24 NOTE — Telephone Encounter (Signed)
Called to check up on patient. Says she is doing well. Feels like the foot is doing better. Still with small amount of erythema of the toe but has not spread up the foot. No fevers or chills. Was able to take the abx without difficulty. Says that she couldn't afford the podiatrist co-pay so I urged her to follow-up with a more affordable podiatrist or her PCP.

## 2022-07-02 ENCOUNTER — Emergency Department (HOSPITAL_BASED_OUTPATIENT_CLINIC_OR_DEPARTMENT_OTHER): Payer: Self-pay

## 2022-07-02 ENCOUNTER — Observation Stay (HOSPITAL_BASED_OUTPATIENT_CLINIC_OR_DEPARTMENT_OTHER)
Admission: EM | Admit: 2022-07-02 | Discharge: 2022-07-03 | Disposition: A | Discharge status: Home / Self Care | Payer: Self-pay | Attending: Family Medicine | Admitting: Family Medicine

## 2022-07-02 ENCOUNTER — Encounter (HOSPITAL_BASED_OUTPATIENT_CLINIC_OR_DEPARTMENT_OTHER): Payer: Self-pay | Admitting: Emergency Medicine

## 2022-07-02 ENCOUNTER — Encounter (HOSPITAL_COMMUNITY): Payer: Self-pay

## 2022-07-02 DIAGNOSIS — Z79899 Other long term (current) drug therapy: Secondary | ICD-10-CM | POA: Insufficient documentation

## 2022-07-02 DIAGNOSIS — Z7984 Long term (current) use of oral hypoglycemic drugs: Secondary | ICD-10-CM | POA: Insufficient documentation

## 2022-07-02 DIAGNOSIS — I10 Essential (primary) hypertension: Secondary | ICD-10-CM | POA: Insufficient documentation

## 2022-07-02 DIAGNOSIS — I161 Hypertensive emergency: Secondary | ICD-10-CM | POA: Insufficient documentation

## 2022-07-02 DIAGNOSIS — Z7902 Long term (current) use of antithrombotics/antiplatelets: Secondary | ICD-10-CM | POA: Insufficient documentation

## 2022-07-02 DIAGNOSIS — E1159 Type 2 diabetes mellitus with other circulatory complications: Secondary | ICD-10-CM | POA: Diagnosis present

## 2022-07-02 DIAGNOSIS — E11621 Type 2 diabetes mellitus with foot ulcer: Secondary | ICD-10-CM | POA: Insufficient documentation

## 2022-07-02 DIAGNOSIS — E1165 Type 2 diabetes mellitus with hyperglycemia: Secondary | ICD-10-CM | POA: Insufficient documentation

## 2022-07-02 DIAGNOSIS — E119 Type 2 diabetes mellitus without complications: Secondary | ICD-10-CM

## 2022-07-02 DIAGNOSIS — L97519 Non-pressure chronic ulcer of other part of right foot with unspecified severity: Secondary | ICD-10-CM | POA: Insufficient documentation

## 2022-07-02 DIAGNOSIS — Z87891 Personal history of nicotine dependence: Secondary | ICD-10-CM | POA: Insufficient documentation

## 2022-07-02 DIAGNOSIS — Z20822 Contact with and (suspected) exposure to covid-19: Secondary | ICD-10-CM | POA: Insufficient documentation

## 2022-07-02 DIAGNOSIS — R200 Anesthesia of skin: Principal | ICD-10-CM

## 2022-07-02 DIAGNOSIS — I152 Hypertension secondary to endocrine disorders: Secondary | ICD-10-CM | POA: Diagnosis present

## 2022-07-02 DIAGNOSIS — Z7982 Long term (current) use of aspirin: Secondary | ICD-10-CM | POA: Insufficient documentation

## 2022-07-02 DIAGNOSIS — Z8673 Personal history of transient ischemic attack (TIA), and cerebral infarction without residual deficits: Secondary | ICD-10-CM | POA: Insufficient documentation

## 2022-07-02 DIAGNOSIS — G459 Transient cerebral ischemic attack, unspecified: Secondary | ICD-10-CM

## 2022-07-02 LAB — CBC
HCT: 36.9 % (ref 36.0–46.0)
Hemoglobin: 11.7 g/dL — ABNORMAL LOW (ref 12.0–15.0)
MCH: 23.1 pg — ABNORMAL LOW (ref 26.0–34.0)
MCHC: 31.7 g/dL (ref 30.0–36.0)
MCV: 72.8 fL — ABNORMAL LOW (ref 80.0–100.0)
Platelets: 352 10*3/uL (ref 150–400)
RBC: 5.07 MIL/uL (ref 3.87–5.11)
RDW: 14.2 % (ref 11.5–15.5)
WBC: 5.8 10*3/uL (ref 4.0–10.5)
nRBC: 0 % (ref 0.0–0.2)

## 2022-07-02 LAB — COMPREHENSIVE METABOLIC PANEL
ALT: 15 U/L (ref 0–44)
AST: 15 U/L (ref 15–41)
Albumin: 3.6 g/dL (ref 3.5–5.0)
Alkaline Phosphatase: 75 U/L (ref 38–126)
Anion gap: 7 (ref 5–15)
BUN: 25 mg/dL — ABNORMAL HIGH (ref 6–20)
CO2: 26 mmol/L (ref 22–32)
Calcium: 9 mg/dL (ref 8.9–10.3)
Chloride: 102 mmol/L (ref 98–111)
Creatinine, Ser: 1.27 mg/dL — ABNORMAL HIGH (ref 0.44–1.00)
GFR, Estimated: 49 mL/min — ABNORMAL LOW (ref >60)
Glucose, Bld: 200 mg/dL — ABNORMAL HIGH (ref 70–99)
Potassium: 3.8 mmol/L (ref 3.5–5.1)
Sodium: 135 mmol/L (ref 135–145)
Total Bilirubin: 0.6 mg/dL (ref 0.3–1.2)
Total Protein: 7.7 g/dL (ref 6.5–8.1)

## 2022-07-02 LAB — MAGNESIUM: Magnesium: 1.9 mg/dL (ref 1.7–2.4)

## 2022-07-02 LAB — URINALYSIS, ROUTINE W REFLEX MICROSCOPIC
Bilirubin Urine: NEGATIVE
Glucose, UA: NEGATIVE mg/dL
Ketones, ur: NEGATIVE mg/dL
Leukocytes,Ua: NEGATIVE
Nitrite: NEGATIVE
Protein, ur: NEGATIVE mg/dL
Specific Gravity, Urine: 1.02 (ref 1.005–1.030)
pH: 5.5 (ref 5.0–8.0)

## 2022-07-02 LAB — RESP PANEL BY RT-PCR (FLU A&B, COVID) ARPGX2
Influenza A by PCR: NEGATIVE
Influenza B by PCR: NEGATIVE
SARS Coronavirus 2 by RT PCR: NEGATIVE

## 2022-07-02 LAB — URINALYSIS, MICROSCOPIC (REFLEX): WBC, UA: NONE SEEN WBC/hpf (ref 0–5)

## 2022-07-02 LAB — APTT: aPTT: 27 seconds (ref 24–36)

## 2022-07-02 LAB — PROTIME-INR
INR: 0.9 (ref 0.8–1.2)
Prothrombin Time: 12.4 seconds (ref 11.4–15.2)

## 2022-07-02 LAB — DIFFERENTIAL
Abs Immature Granulocytes: 0.02 10*3/uL (ref 0.00–0.07)
Basophils Absolute: 0 10*3/uL (ref 0.0–0.1)
Basophils Relative: 0 %
Eosinophils Absolute: 0 10*3/uL (ref 0.0–0.5)
Eosinophils Relative: 0 %
Immature Granulocytes: 0 %
Lymphocytes Relative: 28 %
Lymphs Abs: 1.6 10*3/uL (ref 0.7–4.0)
Monocytes Absolute: 0.5 10*3/uL (ref 0.1–1.0)
Monocytes Relative: 8 %
Neutro Abs: 3.6 10*3/uL (ref 1.7–7.7)
Neutrophils Relative %: 64 %

## 2022-07-02 LAB — RAPID URINE DRUG SCREEN, HOSP PERFORMED
Amphetamines: NOT DETECTED
Barbiturates: NOT DETECTED
Benzodiazepines: NOT DETECTED
Cocaine: NOT DETECTED
Opiates: NOT DETECTED
Tetrahydrocannabinol: NOT DETECTED

## 2022-07-02 LAB — ETHANOL: Alcohol, Ethyl (B): <10 mg/dL (ref <10)

## 2022-07-02 LAB — CBG MONITORING, ED: Glucose-Capillary: 193 mg/dL — ABNORMAL HIGH (ref 70–99)

## 2022-07-02 MED ORDER — SENNOSIDES-DOCUSATE SODIUM 8.6-50 MG PO TABS: 1.0000 | ORAL_TABLET | Freq: Every evening | ORAL | Status: DC | PRN

## 2022-07-02 MED ORDER — LACTATED RINGERS IV BOLUS
1000.0000 mL | Freq: Once | INTRAVENOUS | Status: AC
  Administered 2022-07-02: 1000 mL via INTRAVENOUS

## 2022-07-02 MED ORDER — ONDANSETRON HCL 4 MG/2ML IJ SOLN: 4.0000 mg | Freq: Four times a day (QID) | INTRAMUSCULAR | Status: DC | PRN

## 2022-07-02 MED ORDER — STROKE: EARLY STAGES OF RECOVERY BOOK
Freq: Once | Status: AC
  Filled 2022-07-02: qty 1

## 2022-07-02 MED ORDER — ASPIRIN 81 MG PO CHEW
81.0000 mg | CHEWABLE_TABLET | Freq: Once | ORAL | Status: AC
  Administered 2022-07-02: 81 mg via ORAL
  Filled 2022-07-02: qty 1

## 2022-07-02 MED ORDER — ACETAMINOPHEN 160 MG/5ML PO SOLN: 650.0000 mg | ORAL | Status: DC | PRN

## 2022-07-02 MED ORDER — CLOPIDOGREL BISULFATE 75 MG PO TABS
75.0000 mg | ORAL_TABLET | Freq: Once | ORAL | Status: AC
  Administered 2022-07-02: 75 mg via ORAL
  Filled 2022-07-02: qty 1

## 2022-07-02 MED ORDER — ASPIRIN 81 MG PO CHEW
81.0000 mg | CHEWABLE_TABLET | Freq: Every day | ORAL | Status: DC
  Administered 2022-07-03: 81 mg via ORAL
  Filled 2022-07-02: qty 1

## 2022-07-02 MED ORDER — INSULIN ASPART 100 UNIT/ML IJ SOLN: 0.0000 [IU] | Freq: Every day | INTRAMUSCULAR | Status: DC

## 2022-07-02 MED ORDER — INSULIN ASPART 100 UNIT/ML IJ SOLN: 0.0000 [IU] | Freq: Three times a day (TID) | INTRAMUSCULAR | Status: DC

## 2022-07-02 MED ORDER — ENOXAPARIN SODIUM 40 MG/0.4ML IJ SOSY
40.0000 mg | PREFILLED_SYRINGE | INTRAMUSCULAR | Status: DC
  Filled 2022-07-02: qty 0.4

## 2022-07-02 MED ORDER — ACETAMINOPHEN 650 MG RE SUPP: 650.0000 mg | RECTAL | Status: DC | PRN

## 2022-07-02 MED ORDER — ACETAMINOPHEN 325 MG PO TABS: 650.0000 mg | ORAL_TABLET | ORAL | Status: DC | PRN

## 2022-07-02 MED ORDER — GABAPENTIN 300 MG PO CAPS
300.0000 mg | ORAL_CAPSULE | Freq: Two times a day (BID) | ORAL | Status: DC
  Administered 2022-07-03 (×2): 300 mg via ORAL
  Filled 2022-07-02 (×2): qty 1

## 2022-07-02 MED ORDER — SODIUM CHLORIDE 0.9 % IV SOLN: INTRAVENOUS | Status: AC

## 2022-07-02 MED ORDER — ATORVASTATIN CALCIUM 40 MG PO TABS
40.0000 mg | ORAL_TABLET | Freq: Every day | ORAL | Status: DC
  Administered 2022-07-03: 40 mg via ORAL
  Filled 2022-07-02: qty 1

## 2022-07-02 MED ORDER — CLOPIDOGREL BISULFATE 75 MG PO TABS
75.0000 mg | ORAL_TABLET | Freq: Every day | ORAL | Status: DC
  Administered 2022-07-03: 75 mg via ORAL
  Filled 2022-07-02: qty 1

## 2022-07-02 NOTE — ED Provider Notes (Signed)
[facility] EMERGENCY DEPARTMENT Provider Note   CSN: [id number] Arrival date & time: 07/02/22  1551     History  Chief Complaint  Patient presents with   Numbness   Toe Pain    Kathleen Mcmillan is a 56 y.o. female.  HPI 56 year old female with a history of hypertension and diabetes presents with left leg numbness.  She was brought in by EMS.  Patient originally reported to EMS chest pain though she clarifies this to tell me that she had palpitations and no pain.  She tells me that her left leg started feeling numb at noon today.  However she clarifies this by saying that she felt her leg and it felt numb.  Earlier in the morning she felt like her left face felt "weird".  She noticed her left foot has been numb since yesterday or the day before.  She denies any weakness in any extremity or numbness in the left upper extremity.  She feels little bit of pressure in her head but no significant headache.  No fevers.  She is also been dealing with a nonhealing and she thinks a little bit worsening right great toe wound.  She states that recently "opened up".  She denies any trouble speaking.  Felt like her left eye was draining earlier this morning but that is gone. No tingling, but when she rubs her hand on her leg it feels numb.  Home Medications Prior to Admission medications   Medication Sig Start Date End Date Taking? Authorizing Provider  Accu-Chek Softclix Lancets lancets Use as directed. 04/07/21   Hongalgi, Lenis Dickinson, MD  aspirin 81 MG EC tablet Take 1 tablet (81 mg total) by mouth daily. Swallow whole. 04/07/21   Hongalgi, Lenis Dickinson, MD  atorvastatin (LIPITOR) 40 MG tablet Take 1 tablet (40 mg total) by mouth daily. Patient not taking: Reported on 07/13/2021 04/07/21   Modena Jansky, MD  Blood Glucose Monitoring Suppl (ACCU-CHEK GUIDE) w/Device KIT Use as directed 04/07/21   Modena Jansky, MD  clopidogrel (PLAVIX) 75 MG tablet Take 75 mg by mouth daily. Take one by mouth  daily.    [provider]  clotrimazole (LOTRIMIN) 1 % cream Apply 1 Application topically 2 (two) times daily. 06/12/22   Allwardt, Randa Evens, PA-C  cyclobenzaprine (FLEXERIL) 5 MG tablet cyclobenzaprine 5 mg tablet  Take 1 tablet 3 times a day by oral route.    [provider]  glimepiride (AMARYL) 2 MG tablet Take 1 tablet (2 mg total) by mouth every morning. 09/01/21 11/30/21  Bo Merino I, NP  glucose blood (ACCU-CHEK GUIDE) test strip Use as instructed up to 4 times daily 04/07/21   Modena Jansky, MD  hydrochlorothiazide (HYDRODIURIL) 25 MG tablet Take 1 tablet (25 mg total) by mouth daily. Take one tablet daily 08/26/21 09/25/21  Cardama, Grayce Sessions, MD  ibuprofen (ADVIL) 200 MG tablet Take 200 mg by mouth every 6 (six) hours as needed for moderate pain.    [provider]  omeprazole (PRILOSEC) 20 MG capsule omeprazole 20 mg capsule,delayed release  Take 1 capsule every day by oral route.    [provider]      Allergies    Eggs or egg-derived products, Mango flavor, Tetracycline, Tetracyclines & related, Demeclocycline, Other, and Tape    Review of Systems   Review of Systems  Constitutional:  Negative for fever.  Eyes:  Negative for visual disturbance.  Cardiovascular:  Positive for palpitations. Negative for chest  pain.  Skin:  Positive for wound.  Neurological:  Positive for numbness and headaches. Negative for speech difficulty and weakness.    Physical Exam Updated Vital Signs BP (!) 177/108 Comment: RN notified  Pulse 89   Temp 98 F (36.7 C)   Resp 19   Ht _0  (1.676 m)   Wt 84 kg   SpO2 100%   BMI 29.89 kg/m  Physical Exam Vitals and nursing note reviewed.  Constitutional:      Appearance: She is well-developed.  HENT:     Head: Normocephalic and atraumatic.  Cardiovascular:     Rate and Rhythm: Regular rhythm. Tachycardia present.     Pulses:          Dorsalis pedis pulses are 1+ on the right side and 1+ on  the left side.       Posterior tibial pulses are 1+ on the right side and 1+ on the left side.     Heart sounds: Normal heart sounds.     Comments: Faint but symmetric foot pulses.  Confirmed with Doppler in the left foot. Pulmonary:     Effort: Pulmonary effort is normal.     Breath sounds: Normal breath sounds.  Abdominal:     Palpations: Abdomen is soft.     Tenderness: There is no abdominal tenderness.  Musculoskeletal:       Feet:  Skin:    General: Skin is warm and dry.  Neurological:     Mental Status: She is alert.     Comments: CN 3-12 grossly intact. 5/5 strength in all 4 extremities. Grossly normal sensation, including in the face and the left foot/leg.  Normal finger to nose.      ED Results / Procedures / Treatments   Labs (all labs ordered are listed, but only abnormal results are displayed) Labs Reviewed  CBC - Abnormal; Notable for the following components:      Result Value   Hemoglobin 11.7 (*)    MCV 72.8 (*)    MCH 23.1 (*)    All other components within normal limits  COMPREHENSIVE METABOLIC PANEL - Abnormal; Notable for the following components:   Glucose, Bld 200 (*)    BUN 25 (*)    Creatinine, Ser 1.27 (*)    GFR, Estimated 49 (*)    All other components within normal limits  URINALYSIS, ROUTINE W REFLEX MICROSCOPIC - Abnormal; Notable for the following components:   Hgb urine dipstick TRACE (*)    All other components within normal limits  URINALYSIS, MICROSCOPIC (REFLEX) - Abnormal; Notable for the following components:   Bacteria, UA RARE (*)    All other components within normal limits  CBG MONITORING, ED - Abnormal; Notable for the following components:   Glucose-Capillary 193 (*)    All other components within normal limits  RESP PANEL BY RT-PCR (FLU A&B, COVID) ARPGX2  ETHANOL  PROTIME-INR  APTT  DIFFERENTIAL  RAPID URINE DRUG SCREEN, HOSP PERFORMED  MAGNESIUM    EKG EKG Interpretation  Date/Time:  Friday July 02 2022  16:24:02 EDT Ventricular Rate:  95 PR Interval:  128 QRS Duration: 83 QT Interval:  367 QTC Calculation: 462 R Axis:   30 Text Interpretation: Sinus rhythm Low voltage, precordial leads no significant change since Feb 2023 Confirmed by Sherwood Gambler [phone]) on 07/02/2022 4:37:34 PM  Radiology DG Foot Complete Right  Result Date: 07/02/2022 CLINICAL DATA:  Trauma EXAM: RIGHT FOOT COMPLETE - 3+ VIEW COMPARISON:  06/12/2022 FINDINGS: No  fracture or dislocation is seen. There are no focal lytic lesions. Degenerative changes are noted in first metatarsophalangeal joint and the interphalangeal joint of the big toe. Bony spurs are noted in the dorsal aspect of intertarsal joints. Plantar spur is seen in calcaneus. No significant interval changes are noted. IMPRESSION: No recent fracture or dislocation is seen. Plantar spur is seen in calcaneus. Degenerative changes are noted in multiple joints as described in the body of the report. Electronically Signed   By: Elmer Picker M.D.   On: 07/02/2022 17:01   CT HEAD WO CONTRAST  Result Date: 07/02/2022 CLINICAL DATA:  Stroke suspected, left lower leg numbness EXAM: CT HEAD WITHOUT CONTRAST TECHNIQUE: Contiguous axial images were obtained from the base of the skull through the vertex without intravenous contrast. RADIATION DOSE REDUCTION: This exam was performed according to the departmental dose-optimization program which includes automated exposure control, adjustment of the mA and/or kV according to patient size and/or use of iterative reconstruction technique. COMPARISON:  04/06/2021 FINDINGS: Brain: No evidence of acute infarction, hemorrhage, hydrocephalus, extra-axial collection or mass lesion/mass effect. Vascular: No hyperdense vessel or unexpected calcification. Skull: Normal. Negative for fracture or focal lesion. Sinuses/Orbits: No acute finding. Other: None. IMPRESSION: No acute intracranial pathology. No non-contrast CT findings to explain  left-sided numbness. Consider MRI to more sensitively evaluate for acute diffusion restricting infarction if suspected. Electronically Signed   By: Delanna Ahmadi M.D.   On: 07/02/2022 16:56    Procedures Procedures    Medications Ordered in ED Medications  lactated ringers bolus 1,000 mL (0 mLs Intravenous Stopped 07/02/22 1805)  clopidogrel (PLAVIX) tablet 75 mg (75 mg Oral Given 07/02/22 1802)  aspirin chewable tablet 81 mg (81 mg Oral Given 07/02/22 1802)    ED Course/ Medical Decision Making/ A&P Clinical Course as of 07/02/22 1914  Fri Jul 02, 2022  1613 Patient is a difficult historian as far as onset of symptoms but based on history, it sounds like this is not within the code stroke TNK window or an LVO. [SG]    Clinical Course User Index [SG] Sherwood Gambler, MD                           Medical Decision Making Amount and/or Complexity of Data Reviewed Labs: ordered.    Details: Mild hyperglycemia and mild bump in creatinine compared to baseline.  No DKA.  Other labs pretty unremarkable including potassium/magnesium. Radiology: ordered and independent interpretation performed.    Details: CT head without head bleed.  No obvious osteomyelitis on foot x-ray. ECG/medicine tests: independent interpretation performed.    Details: No arrhythmia/ischemia  Risk OTC drugs. Prescription drug management. Decision regarding hospitalization.   This is atypical for stroke but otherwise there is not a clear cause of why she is having the numbness.  Doubt limb ischemia.  Given her transient facial symptoms I think she will need stroke work-up.  Discussed with Dr. Quinn Axe who agrees and recommends baby aspirin and Plavix now and upon admission.  She will need transfer and admission to [facility].  Neuro can follow there.  Discussed with Dr. Posey Pronto for admission.  Not within the code stroke window here.  For her foot, she had a progressively worsening toe.  Seems to be a diabetic wound but I  think osteomyelitis is less likely given the superficial nature.  However it might help to have podiatry or Ortho see her while in the hospital.  She is  currently on antibiotics and there is no signs that she is got a systemic infection currently.        Final Clinical Impression(s) / ED Diagnoses Final diagnoses:  Left leg numbness    Rx / DC Orders ED Discharge Orders     None         Sherwood Gambler, MD 07/02/22 [phone]

## 2022-07-02 NOTE — Care Plan (Signed)
CTA Head and Neck ordered on this pt.  Pt needs a 20G or larger IV in [facility] for this exam.  Pt. Chart shows only a 22G.  Attempted to call floor with no answer.

## 2022-07-02 NOTE — ED Notes (Signed)
Report called to shemica RN

## 2022-07-02 NOTE — Assessment & Plan Note (Signed)
No sign of acute infection, x-ray reassuring.  Follow-up with podiatry.

## 2022-07-02 NOTE — Assessment & Plan Note (Addendum)
Start SSI.  Follow A1c.

## 2022-07-02 NOTE — Assessment & Plan Note (Signed)
Presenting with transient left lower face and left lower leg numbness.  EDP discussed with on-call neurology who recommended admission for CVA work-up.  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 -Neurology to follow -CT head without acute abnormality -Obtain MRI brain -CTA head/neck -Echocardiogram -Continue aspirin 81 mg and Plavix 75 mg -Start atorvastatin 40 mg daily -Check A1c, lipid panel -Keep on telemetry, continue neurochecks -Allow permissive hypertension for now

## 2022-07-02 NOTE — ED Notes (Addendum)
Attempted to call report RN unavailable, left message to call this RN back
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
## 2022-07-02 NOTE — ED Notes (Signed)
Pt a&O X4 denies any pain or numbness at this time

## 2022-07-02 NOTE — ED Triage Notes (Addendum)
Per EMS, pt coming from home c/o left lower leg numbness since 12 pm today. States she did have some cp earlier, but that resolved. Denies shob, cp at this time. Also c/o right great toe pain.  Initial BP 196/119 Initial CBG 225

## 2022-07-02 NOTE — Hospital Course (Signed)
Kathleen Mcmillan is a 56 y.o. 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

## 2022-07-02 NOTE — Assessment & Plan Note (Signed)
Hold antihypertensives to allow permissive hypertension for now. 

## 2022-07-03 ENCOUNTER — Observation Stay (HOSPITAL_COMMUNITY): Payer: Self-pay

## 2022-07-03 ENCOUNTER — Other Ambulatory Visit: Payer: Self-pay

## 2022-07-03 ENCOUNTER — Observation Stay (HOSPITAL_BASED_OUTPATIENT_CLINIC_OR_DEPARTMENT_OTHER): Payer: Self-pay

## 2022-07-03 DIAGNOSIS — E1159 Type 2 diabetes mellitus with other circulatory complications: Secondary | ICD-10-CM

## 2022-07-03 DIAGNOSIS — I152 Hypertension secondary to endocrine disorders: Secondary | ICD-10-CM

## 2022-07-03 DIAGNOSIS — E782 Mixed hyperlipidemia: Secondary | ICD-10-CM

## 2022-07-03 DIAGNOSIS — R202 Paresthesia of skin: Secondary | ICD-10-CM

## 2022-07-03 DIAGNOSIS — G459 Transient cerebral ischemic attack, unspecified: Secondary | ICD-10-CM

## 2022-07-03 DIAGNOSIS — L97519 Non-pressure chronic ulcer of other part of right foot with unspecified severity: Secondary | ICD-10-CM

## 2022-07-03 DIAGNOSIS — E11621 Type 2 diabetes mellitus with foot ulcer: Secondary | ICD-10-CM

## 2022-07-03 LAB — BASIC METABOLIC PANEL
Anion gap: 6 (ref 5–15)
BUN: 17 mg/dL (ref 6–20)
CO2: 27 mmol/L (ref 22–32)
Calcium: 9 mg/dL (ref 8.9–10.3)
Chloride: 104 mmol/L (ref 98–111)
Creatinine, Ser: 1.1 mg/dL — ABNORMAL HIGH (ref 0.44–1.00)
GFR, Estimated: 59 mL/min — ABNORMAL LOW (ref >60)
Glucose, Bld: 181 mg/dL — ABNORMAL HIGH (ref 70–99)
Potassium: 3.9 mmol/L (ref 3.5–5.1)
Sodium: 137 mmol/L (ref 135–145)

## 2022-07-03 LAB — ECHOCARDIOGRAM COMPLETE
AR max vel: 1.88 cm2
AV Area VTI: 1.96 cm2
AV Area mean vel: 1.91 cm2
AV Mean grad: 5 mmHg
AV Peak grad: 9 mmHg
Ao pk vel: 1.5 m/s
Area-P 1/2: 4.57 cm2
Height: 66 in
S' Lateral: 2.7 cm
Weight: 2962.98 oz

## 2022-07-03 LAB — CBC
HCT: 34.8 % — ABNORMAL LOW (ref 36.0–46.0)
Hemoglobin: 11.2 g/dL — ABNORMAL LOW (ref 12.0–15.0)
MCH: 23.2 pg — ABNORMAL LOW (ref 26.0–34.0)
MCHC: 32.2 g/dL (ref 30.0–36.0)
MCV: 72.2 fL — ABNORMAL LOW (ref 80.0–100.0)
Platelets: 336 10*3/uL (ref 150–400)
RBC: 4.82 MIL/uL (ref 3.87–5.11)
RDW: 14.1 % (ref 11.5–15.5)
WBC: 7.3 10*3/uL (ref 4.0–10.5)
nRBC: 0 % (ref 0.0–0.2)

## 2022-07-03 LAB — TSH: TSH: 1.33 u[IU]/mL (ref 0.350–4.500)

## 2022-07-03 LAB — HEMOGLOBIN A1C
Hgb A1c MFr Bld: 10.3 % — ABNORMAL HIGH (ref 4.8–5.6)
Mean Plasma Glucose: 248.91 mg/dL

## 2022-07-03 LAB — LIPID PANEL
Cholesterol: 229 mg/dL — ABNORMAL HIGH (ref 0–200)
HDL: 42 mg/dL (ref >40)
LDL Cholesterol: 166 mg/dL — ABNORMAL HIGH (ref 0–99)
Total CHOL/HDL Ratio: 5.5 RATIO
Triglycerides: 103 mg/dL (ref <150)
VLDL: 21 mg/dL (ref 0–40)

## 2022-07-03 LAB — HIV ANTIBODY (ROUTINE TESTING W REFLEX): HIV Screen 4th Generation wRfx: NONREACTIVE

## 2022-07-03 LAB — FOLATE: Folate: 17.3 ng/mL (ref >5.9)

## 2022-07-03 LAB — GLUCOSE, CAPILLARY
Glucose-Capillary: 143 mg/dL — ABNORMAL HIGH (ref 70–99)
Glucose-Capillary: 236 mg/dL — ABNORMAL HIGH (ref 70–99)
Glucose-Capillary: 262 mg/dL — ABNORMAL HIGH (ref 70–99)

## 2022-07-03 LAB — VITAMIN B12: Vitamin B-12: 594 pg/mL (ref 180–914)

## 2022-07-03 MED ORDER — IOHEXOL 350 MG/ML SOLN
80.0000 mL | Freq: Once | INTRAVENOUS | Status: AC | PRN
  Administered 2022-07-03: 80 mL via INTRAVENOUS

## 2022-07-03 MED ORDER — MEDIHONEY WOUND/BURN DRESSING EX PSTE
1.0000 | PASTE | Freq: Every day | CUTANEOUS | Status: DC
  Administered 2022-07-03: 1 via TOPICAL
  Filled 2022-07-03 (×2): qty 44

## 2022-07-03 MED ORDER — GABAPENTIN 300 MG PO CAPS: 300.0000 mg | ORAL_CAPSULE | Freq: Two times a day (BID) | ORAL | 2 refills | Status: DC

## 2022-07-03 MED ORDER — MEDIHONEY WOUND/BURN DRESSING EX PSTE: 1.0000 | PASTE | Freq: Every day | CUTANEOUS | 0 refills | Status: AC

## 2022-07-03 MED ORDER — ATORVASTATIN CALCIUM 40 MG PO TABS
40.0000 mg | ORAL_TABLET | Freq: Every day | ORAL | 1 refills | Status: AC
Start: 2022-07-03 — End: ?

## 2022-07-03 MED ORDER — ASPIRIN 81 MG PO CHEW: 81.0000 mg | CHEWABLE_TABLET | Freq: Every day | ORAL | 3 refills | Status: AC

## 2022-07-03 NOTE — Progress Notes (Signed)
  Echocardiogram 2D Echocardiogram has been performed.  Roosvelt Maser F 07/03/2022, 2:22 PM

## 2022-07-03 NOTE — Progress Notes (Signed)
Discharge teaching provided. Meds, diet, activity, follow up appointments reviewed and all questions answered. Patient discharged to lobby via wheelchair to wait for son per patient request.
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
## 2022-07-03 NOTE — Plan of Care (Signed)
Problem: Education: Goal: Knowledge of General Education information will improve Description: Including pain rating scale, medication(s)/side effects and non-pharmacologic comfort measures 07/03/2022 0100 by Trellis Moment, RN Outcome: Progressing 07/03/2022 0057 by Trellis Moment, RN Outcome: Progressing   Problem: Health Behavior/Discharge Planning: Goal: Ability to manage health-related needs will improve 07/03/2022 0100 by Trellis Moment, RN Outcome: Progressing 07/03/2022 0057 by Trellis Moment, RN Outcome: Progressing   Problem: Clinical Measurements: Goal: Ability to maintain clinical measurements within normal limits will improve 07/03/2022 0100 by Trellis Moment, RN Outcome: Progressing 07/03/2022 0057 by Trellis Moment, RN Outcome: Progressing Goal: Will remain free from infection 07/03/2022 0100 by Trellis Moment, RN Outcome: Progressing 07/03/2022 0057 by Trellis Moment, RN Outcome: Progressing Goal: Diagnostic test results will improve 07/03/2022 0100 by Trellis Moment, RN Outcome: Progressing 07/03/2022 0057 by Trellis Moment, RN Outcome: Progressing Goal: Respiratory complications will improve 07/03/2022 0100 by Trellis Moment, RN Outcome: Progressing 07/03/2022 0057 by Trellis Moment, RN Outcome: Progressing Goal: Cardiovascular complication will be avoided 07/03/2022 0100 by Trellis Moment, RN Outcome: Progressing 07/03/2022 0057 by Trellis Moment, RN Outcome: Progressing   Problem: Activity: Goal: Risk for activity intolerance will decrease 07/03/2022 0100 by Trellis Moment, RN Outcome: Progressing 07/03/2022 0057 by Trellis Moment, RN Outcome: Progressing   Problem: Nutrition: Goal: Adequate nutrition will be maintained 07/03/2022 0100 by Trellis Moment, RN Outcome: Progressing 07/03/2022 0057 by Trellis Moment, RN Outcome: Progressing   Problem: Coping: Goal: Level of anxiety will decrease 07/03/2022 0100 by Trellis Moment, RN Outcome:  Progressing 07/03/2022 0057 by Trellis Moment, RN Outcome: Progressing   Problem: Elimination: Goal: Will not experience complications related to bowel motility 07/03/2022 0100 by Trellis Moment, RN Outcome: Progressing 07/03/2022 0057 by Trellis Moment, RN Outcome: Progressing Goal: Will not experience complications related to urinary retention 07/03/2022 0100 by Trellis Moment, RN Outcome: Progressing 07/03/2022 0057 by Trellis Moment, RN Outcome: Progressing   Problem: Pain Managment: Goal: General experience of comfort will improve 07/03/2022 0100 by Trellis Moment, RN Outcome: Progressing 07/03/2022 0057 by Trellis Moment, RN Outcome: Progressing   Problem: Safety: Goal: Ability to remain free from injury will improve 07/03/2022 0100 by Trellis Moment, RN Outcome: Progressing 07/03/2022 0057 by Trellis Moment, RN Outcome: Progressing   Problem: Skin Integrity: Goal: Risk for impaired skin integrity will decrease 07/03/2022 0100 by Trellis Moment, RN Outcome: Progressing 07/03/2022 0057 by Trellis Moment, RN Outcome: Progressing   Problem: Education: Goal: Ability to describe self-care measures that may prevent or decrease complications (Diabetes Survival Skills Education) will improve 07/03/2022 0100 by Trellis Moment, RN Outcome: Progressing 07/03/2022 0057 by Trellis Moment, RN Outcome: Progressing Goal: Individualized Educational Video(s) 07/03/2022 0100 by Trellis Moment, RN Outcome: Progressing 07/03/2022 0057 by Trellis Moment, RN Outcome: Progressing   Problem: Coping: Goal: Ability to adjust to condition or change in health will improve 07/03/2022 0100 by Trellis Moment, RN Outcome: Progressing 07/03/2022 0057 by Trellis Moment, RN Outcome: Progressing   Problem: Fluid Volume: Goal: Ability to maintain a balanced intake and output will improve 07/03/2022 0100 by Trellis Moment, RN Outcome: Progressing 07/03/2022 0057 by Trellis Moment, RN Outcome: Progressing    Problem: Health Behavior/Discharge Planning: Goal: Ability to identify and utilize available resources and services will improve 07/03/2022 0100 by Trellis Moment, RN Outcome: Progressing 07/03/2022 0057 by Trellis Moment, RN Outcome: Progressing Goal: Ability to manage health-related needs will improve 07/03/2022 0100 by Trellis Moment, RN Outcome: Progressing 07/03/2022 0057 by Trellis Moment, RN Outcome: Progressing   Problem: Metabolic: Goal: Ability to maintain appropriate glucose levels will improve  07/03/2022 0100 by Trellis Moment, RN Outcome: Progressing 07/03/2022 0057 by Trellis Moment, RN Outcome: Progressing   Problem: Nutritional: Goal: Maintenance of adequate nutrition will improve 07/03/2022 0100 by Trellis Moment, RN Outcome: Progressing 07/03/2022 0057 by Trellis Moment, RN Outcome: Progressing Goal: Progress toward achieving an optimal weight will improve 07/03/2022 0100 by Trellis Moment, RN Outcome: Progressing 07/03/2022 0057 by Trellis Moment, RN Outcome: Progressing   Problem: Skin Integrity: Goal: Risk for impaired skin integrity will decrease 07/03/2022 0100 by Trellis Moment, RN Outcome: Progressing 07/03/2022 0057 by Trellis Moment, RN Outcome: Progressing   Problem: Tissue Perfusion: Goal: Adequacy of tissue perfusion will improve 07/03/2022 0100 by Trellis Moment, RN Outcome: Progressing 07/03/2022 0057 by Trellis Moment, RN Outcome: Progressing
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
## 2022-07-03 NOTE — Plan of Care (Signed)
Problem: Education: Goal: Knowledge of General Education information will improve Description: Including pain rating scale, medication(s)/side effects and non-pharmacologic comfort measures 07/03/2022 0100 by Trellis Moment, RN Outcome: Progressing 07/03/2022 0057 by Trellis Moment, RN Outcome: Progressing   Problem: Health Behavior/Discharge Planning: Goal: Ability to manage health-related needs will improve 07/03/2022 0100 by Trellis Moment, RN Outcome: Progressing 07/03/2022 0057 by Trellis Moment, RN Outcome: Progressing   Problem: Clinical Measurements: Goal: Ability to maintain clinical measurements within normal limits will improve 07/03/2022 0100 by Trellis Moment, RN Outcome: Progressing 07/03/2022 0057 by Trellis Moment, RN Outcome: Progressing Goal: Will remain free from infection 07/03/2022 0100 by Trellis Moment, RN Outcome: Progressing 07/03/2022 0057 by Trellis Moment, RN Outcome: Progressing Goal: Diagnostic test results will improve 07/03/2022 0100 by Trellis Moment, RN Outcome: Progressing 07/03/2022 0057 by Trellis Moment, RN Outcome: Progressing Goal: Respiratory complications will improve 07/03/2022 0100 by Trellis Moment, RN Outcome: Progressing 07/03/2022 0057 by Trellis Moment, RN Outcome: Progressing Goal: Cardiovascular complication will be avoided 07/03/2022 0100 by Trellis Moment, RN Outcome: Progressing 07/03/2022 0057 by Trellis Moment, RN Outcome: Progressing   Problem: Activity: Goal: Risk for activity intolerance will decrease 07/03/2022 0100 by Trellis Moment, RN Outcome: Progressing 07/03/2022 0057 by Trellis Moment, RN Outcome: Progressing   Problem: Nutrition: Goal: Adequate nutrition will be maintained 07/03/2022 0100 by Trellis Moment, RN Outcome: Progressing 07/03/2022 0057 by Trellis Moment, RN Outcome: Progressing   Problem: Coping: Goal: Level of anxiety will decrease 07/03/2022 0100 by Trellis Moment, RN Outcome:  Progressing 07/03/2022 0057 by Trellis Moment, RN Outcome: Progressing   Problem: Elimination: Goal: Will not experience complications related to bowel motility 07/03/2022 0100 by Trellis Moment, RN Outcome: Progressing 07/03/2022 0057 by Trellis Moment, RN Outcome: Progressing Goal: Will not experience complications related to urinary retention 07/03/2022 0100 by Trellis Moment, RN Outcome: Progressing 07/03/2022 0057 by Trellis Moment, RN Outcome: Progressing   Problem: Pain Managment: Goal: General experience of comfort will improve 07/03/2022 0100 by Trellis Moment, RN Outcome: Progressing 07/03/2022 0057 by Trellis Moment, RN Outcome: Progressing   Problem: Safety: Goal: Ability to remain free from injury will improve 07/03/2022 0100 by Trellis Moment, RN Outcome: Progressing 07/03/2022 0057 by Trellis Moment, RN Outcome: Progressing   Problem: Skin Integrity: Goal: Risk for impaired skin integrity will decrease 07/03/2022 0100 by Trellis Moment, RN Outcome: Progressing 07/03/2022 0057 by Trellis Moment, RN Outcome: Progressing   Problem: Education: Goal: Ability to describe self-care measures that may prevent or decrease complications (Diabetes Survival Skills Education) will improve 07/03/2022 0100 by Trellis Moment, RN Outcome: Progressing 07/03/2022 0057 by Trellis Moment, RN Outcome: Progressing Goal: Individualized Educational Video(s) 07/03/2022 0100 by Trellis Moment, RN Outcome: Progressing 07/03/2022 0057 by Trellis Moment, RN Outcome: Progressing   Problem: Coping: Goal: Ability to adjust to condition or change in health will improve 07/03/2022 0100 by Trellis Moment, RN Outcome: Progressing 07/03/2022 0057 by Trellis Moment, RN Outcome: Progressing   Problem: Fluid Volume: Goal: Ability to maintain a balanced intake and output will improve 07/03/2022 0100 by Trellis Moment, RN Outcome: Progressing 07/03/2022 0057 by Trellis Moment, RN Outcome: Progressing    Problem: Health Behavior/Discharge Planning: Goal: Ability to identify and utilize available resources and services will improve 07/03/2022 0100 by Trellis Moment, RN Outcome: Progressing 07/03/2022 0057 by Trellis Moment, RN Outcome: Progressing Goal: Ability to manage health-related needs will improve 07/03/2022 0100 by Trellis Moment, RN Outcome: Progressing 07/03/2022 0057 by Trellis Moment, RN Outcome: Progressing   Problem: Metabolic: Goal: Ability to maintain appropriate glucose levels will improve  07/03/2022 0100 by Trellis Moment, RN Outcome: Progressing 07/03/2022 0057 by Trellis Moment, RN Outcome: Progressing   Problem: Nutritional: Goal: Maintenance of adequate nutrition will improve 07/03/2022 0100 by Trellis Moment, RN Outcome: Progressing 07/03/2022 0057 by Trellis Moment, RN Outcome: Progressing Goal: Progress toward achieving an optimal weight will improve 07/03/2022 0100 by Trellis Moment, RN Outcome: Progressing 07/03/2022 0057 by Trellis Moment, RN Outcome: Progressing   Problem: Skin Integrity: Goal: Risk for impaired skin integrity will decrease 07/03/2022 0100 by Trellis Moment, RN Outcome: Progressing 07/03/2022 0057 by Trellis Moment, RN Outcome: Progressing   Problem: Tissue Perfusion: Goal: Adequacy of tissue perfusion will improve 07/03/2022 0100 by Trellis Moment, RN Outcome: Progressing 07/03/2022 0057 by Trellis Moment, RN Outcome: Progressing   Problem: Education: Goal: Knowledge of disease or condition will improve Outcome: Progressing Goal: Knowledge of secondary prevention will improve (SELECT ALL) Outcome: Progressing Goal: Knowledge of patient specific risk factors will improve (INDIVIDUALIZE FOR PATIENT) Outcome: Progressing   Problem: Health Behavior/Discharge Planning: Goal: Ability to manage health-related needs will improve Outcome: Progressing   Problem: Self-Care: Goal: Ability to participate in self-care as condition permits  will improve Outcome: Progressing
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
## 2022-07-03 NOTE — Progress Notes (Signed)
OT Cancellation Note  Patient Details Name: Kathleen Mcmillan MRN: [id number] DOB: Jul 01, 1966   Cancelled Treatment:    Reason Eval/Treat Not Completed: OT screened, no needs identified, will sign off Patient seen by PT earlier this date and stating patient is back at baseline. OT discussing role of OT with patient with patient identifying no current acute OT needs or changes in cognition or vision. OT will sign off at this time. Please re-consult if further acute OT needs arise.   Kathleen Mcmillan, OTR/L Acute Rehabilitation Services [phone]   Cherlyn Cushing 07/03/2022, 2:41 PM

## 2022-07-03 NOTE — Consult Note (Signed)
WOC Nurse Consult Note: Reason for Consult:right great toe full thickness ulceration. Patient was been seen for this problem at an [facility] in [facility] [place]) by Dr. Alroy Bailiff on 05/18/22 and patient reports pulling off some dry skin from this area a couple of weeks ago according to EDP. Photodocumentation of wound provided by EDP. Wound type:Neuropathic, trauma Pressure Injury POA: NA Measurement:To be obtained by bedside RN with first dressing change today and documented on Nursing Flow Sheet. Wound DVV:OHYW, dry Drainage (amount, consistency, odor) none Periwound:peeling tissue Dressing procedure/placement/frequency: I have provided Nursing with guidance for the daily care of this digit, cleansing with soap and water, rinsing and drying thoroughly. Application of Medihoney, a topical antimicrobial is to be placed according to directions and topped with dry gauze, then secured with conform bandaging/paper tape.  Heels are to be floated and an sacral silicone foam dressing placed for PI prophylaxis.  WOC nursing team will not follow, but will remain available to this patient, the nursing and medical teams.  Please re-consult if needed.  Thank you for inviting [place] to participate in this patient's Plan of Care.  Ladona Mow, MSN, RN, CNS, GNP, Leda Min, [employer], [employer] phone:  [phone]
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
## 2022-08-16 ENCOUNTER — Inpatient Hospital Stay: Payer: Self-pay | Admitting: Adult Health

## 2022-08-16 ENCOUNTER — Encounter: Payer: Self-pay | Admitting: Adult Health

## 2022-08-16 ENCOUNTER — Inpatient Hospital Stay: Payer: Medicaid Other | Admitting: Adult Health

## 2022-08-29 ENCOUNTER — Emergency Department (HOSPITAL_COMMUNITY)
Admission: EM | Admit: 2022-08-29 | Discharge: 2022-08-29 | Discharge status: Against Medical Advice | Payer: Medicaid Other | Attending: Emergency Medicine | Admitting: Emergency Medicine

## 2022-08-29 ENCOUNTER — Other Ambulatory Visit: Payer: Self-pay

## 2022-08-29 ENCOUNTER — Encounter (HOSPITAL_COMMUNITY): Payer: Self-pay

## 2022-08-29 DIAGNOSIS — Z5321 Procedure and treatment not carried out due to patient leaving prior to being seen by health care provider: Secondary | ICD-10-CM | POA: Insufficient documentation

## 2022-08-29 DIAGNOSIS — M79604 Pain in right leg: Secondary | ICD-10-CM | POA: Insufficient documentation

## 2022-08-29 DIAGNOSIS — M79671 Pain in right foot: Secondary | ICD-10-CM | POA: Insufficient documentation

## 2022-08-29 DIAGNOSIS — R350 Frequency of micturition: Secondary | ICD-10-CM | POA: Insufficient documentation

## 2022-08-29 LAB — URINALYSIS, ROUTINE W REFLEX MICROSCOPIC
Bilirubin Urine: NEGATIVE
Glucose, UA: >=500 mg/dL — AB
Ketones, ur: NEGATIVE mg/dL
Leukocytes,Ua: NEGATIVE
Nitrite: NEGATIVE
Protein, ur: NEGATIVE mg/dL
Specific Gravity, Urine: 1.01 (ref 1.005–1.030)
pH: 5.5 (ref 5.0–8.0)

## 2022-08-29 LAB — URINALYSIS, MICROSCOPIC (REFLEX)

## 2022-08-29 NOTE — ED Notes (Addendum)
Patient just stated to me  "let she was leaving."

## 2022-08-29 NOTE — ED Notes (Signed)
I called patient for vital sign recheck and no one responded 

## 2022-08-29 NOTE — ED Triage Notes (Addendum)
Patient reports that she has an open sore to the right great toe since September. Patient also c/o right foot and right leg pain and slight swelling x a week.  Patient added at the end of triage that she had urinary frequency and urgency x 2 weeks.

## 2022-09-01 DIAGNOSIS — L089 Local infection of the skin and subcutaneous tissue, unspecified: Secondary | ICD-10-CM | POA: Diagnosis not present

## 2022-09-01 DIAGNOSIS — E11621 Type 2 diabetes mellitus with foot ulcer: Secondary | ICD-10-CM | POA: Diagnosis not present

## 2022-09-01 DIAGNOSIS — Z794 Long term (current) use of insulin: Secondary | ICD-10-CM | POA: Diagnosis not present

## 2022-09-02 DIAGNOSIS — L03115 Cellulitis of right lower limb: Secondary | ICD-10-CM | POA: Diagnosis not present

## 2022-09-13 DIAGNOSIS — M869 Osteomyelitis, unspecified: Secondary | ICD-10-CM | POA: Insufficient documentation

## 2022-09-13 DIAGNOSIS — S91101A Unspecified open wound of right great toe without damage to nail, initial encounter: Secondary | ICD-10-CM | POA: Insufficient documentation

## 2022-09-15 DIAGNOSIS — M79671 Pain in right foot: Secondary | ICD-10-CM | POA: Diagnosis not present

## 2022-09-15 DIAGNOSIS — M86171 Other acute osteomyelitis, right ankle and foot: Secondary | ICD-10-CM | POA: Diagnosis not present

## 2022-09-15 DIAGNOSIS — Z5189 Encounter for other specified aftercare: Secondary | ICD-10-CM | POA: Diagnosis not present

## 2022-09-22 ENCOUNTER — Ambulatory Visit (HOSPITAL_COMMUNITY)
Admission: EM | Admit: 2022-09-22 | Discharge: 2022-09-22 | Disposition: A | Discharge status: Home / Self Care | Payer: Medicaid Other

## 2022-09-22 ENCOUNTER — Encounter (HOSPITAL_COMMUNITY): Payer: Self-pay

## 2022-09-22 DIAGNOSIS — R739 Hyperglycemia, unspecified: Secondary | ICD-10-CM

## 2022-09-22 DIAGNOSIS — E1169 Type 2 diabetes mellitus with other specified complication: Secondary | ICD-10-CM

## 2022-09-22 DIAGNOSIS — E11621 Type 2 diabetes mellitus with foot ulcer: Secondary | ICD-10-CM

## 2022-09-22 DIAGNOSIS — E1165 Type 2 diabetes mellitus with hyperglycemia: Secondary | ICD-10-CM

## 2022-09-22 DIAGNOSIS — R42 Dizziness and giddiness: Secondary | ICD-10-CM

## 2022-09-22 DIAGNOSIS — L97519 Non-pressure chronic ulcer of other part of right foot with unspecified severity: Secondary | ICD-10-CM

## 2022-09-22 LAB — CBG MONITORING, ED: Glucose-Capillary: 318 mg/dL — ABNORMAL HIGH (ref 70–99)

## 2022-09-22 MED ORDER — LIDOCAINE HCL (PF) 1 % IJ SOLN
5.0000 mL | Freq: Once | INTRAMUSCULAR | Status: AC
  Administered 2022-09-22: 5 mL

## 2022-09-22 MED ORDER — LIDOCAINE HCL (PF) 1 % IJ SOLN
INTRAMUSCULAR | Status: AC
  Filled 2022-09-22: qty 6

## 2022-09-22 NOTE — ED Triage Notes (Signed)
Toe on right  foot pain x1wk, headache  dizziness, bilateral ear pain  x3days pt has been constipated x1wk
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
## 2022-09-22 NOTE — Discharge Instructions (Addendum)
We explored your toe wound today and did not find any packing. It likely fell out while you were changing your dressing.  Keep it covered. See your Dr. Ottis Stain next week  Your sugar was high today.  Go home and take your Rybelsus and glimepiride Go to the ER if you have chest pain, nausea/vomiting, decreased awareness  It was nice to meet you today!

## 2022-10-06 DIAGNOSIS — M86171 Other acute osteomyelitis, right ankle and foot: Secondary | ICD-10-CM | POA: Diagnosis not present

## 2022-10-06 DIAGNOSIS — M79671 Pain in right foot: Secondary | ICD-10-CM | POA: Diagnosis not present

## 2022-10-06 DIAGNOSIS — Z5189 Encounter for other specified aftercare: Secondary | ICD-10-CM | POA: Diagnosis not present

## 2022-10-11 DIAGNOSIS — S91101A Unspecified open wound of right great toe without damage to nail, initial encounter: Secondary | ICD-10-CM | POA: Diagnosis not present

## 2022-10-11 DIAGNOSIS — M869 Osteomyelitis, unspecified: Secondary | ICD-10-CM | POA: Diagnosis not present

## 2022-10-27 DIAGNOSIS — M86171 Other acute osteomyelitis, right ankle and foot: Secondary | ICD-10-CM | POA: Diagnosis not present

## 2022-10-27 DIAGNOSIS — Z5189 Encounter for other specified aftercare: Secondary | ICD-10-CM | POA: Diagnosis not present

## 2022-10-27 DIAGNOSIS — M79671 Pain in right foot: Secondary | ICD-10-CM | POA: Diagnosis not present

## 2022-11-02 DIAGNOSIS — L97511 Non-pressure chronic ulcer of other part of right foot limited to breakdown of skin: Secondary | ICD-10-CM | POA: Diagnosis not present

## 2022-11-02 DIAGNOSIS — M205X1 Other deformities of toe(s) (acquired), right foot: Secondary | ICD-10-CM | POA: Diagnosis not present

## 2022-11-02 DIAGNOSIS — M86171 Other acute osteomyelitis, right ankle and foot: Secondary | ICD-10-CM | POA: Diagnosis not present

## 2022-11-02 DIAGNOSIS — E1142 Type 2 diabetes mellitus with diabetic polyneuropathy: Secondary | ICD-10-CM | POA: Diagnosis not present

## 2022-11-02 DIAGNOSIS — M722 Plantar fascial fibromatosis: Secondary | ICD-10-CM | POA: Diagnosis not present

## 2022-11-02 DIAGNOSIS — M205X2 Other deformities of toe(s) (acquired), left foot: Secondary | ICD-10-CM | POA: Diagnosis not present

## 2022-11-15 ENCOUNTER — Other Ambulatory Visit: Payer: Self-pay

## 2022-11-24 DIAGNOSIS — E113413 Type 2 diabetes mellitus with severe nonproliferative diabetic retinopathy with macular edema, bilateral: Secondary | ICD-10-CM | POA: Diagnosis not present

## 2022-11-24 DIAGNOSIS — H524 Presbyopia: Secondary | ICD-10-CM | POA: Diagnosis not present

## 2022-11-24 DIAGNOSIS — H04123 Dry eye syndrome of bilateral lacrimal glands: Secondary | ICD-10-CM | POA: Diagnosis not present

## 2022-11-24 DIAGNOSIS — H5213 Myopia, bilateral: Secondary | ICD-10-CM | POA: Diagnosis not present

## 2022-11-24 DIAGNOSIS — H26493 Other secondary cataract, bilateral: Secondary | ICD-10-CM | POA: Diagnosis not present

## 2022-11-26 ENCOUNTER — Ambulatory Visit
Admission: EM | Admit: 2022-11-26 | Discharge: 2022-11-26 | Disposition: A | Discharge status: Home / Self Care | Payer: Medicaid Other

## 2022-11-26 DIAGNOSIS — S46912A Strain of unspecified muscle, fascia and tendon at shoulder and upper arm level, left arm, initial encounter: Secondary | ICD-10-CM

## 2022-11-26 DIAGNOSIS — E1159 Type 2 diabetes mellitus with other circulatory complications: Secondary | ICD-10-CM

## 2022-11-26 MED ORDER — HYDROCHLOROTHIAZIDE 25 MG PO TABS: 25.0000 mg | ORAL_TABLET | Freq: Every day | ORAL | 0 refills | Status: DC

## 2022-11-26 MED ORDER — DICLOFENAC SODIUM 1 % EX CREA: 2.0000 g | TOPICAL_CREAM | Freq: Four times a day (QID) | CUTANEOUS | 0 refills | Status: AC | PRN

## 2022-11-26 NOTE — Discharge Instructions (Signed)
Diclofenac topical anti-inflammatory cream to your left upper arm 4 times a day as needed Heat to the affected area as needed May continue Tylenol as needed Follow-up with your PCP at scheduled appointment next week Please go to the emergency room for any worsening symptoms
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
## 2022-11-26 NOTE — ED Triage Notes (Signed)
Pt requesting refill on HTZ medication.

## 2022-11-26 NOTE — ED Triage Notes (Addendum)
Pt c/o left arm pain that began a while ago. The patient reports today she developed left shoulder/ neck pain.   Home interventions: tylenol, naproxen   The patient states she is needing help with a callus to her right great toe.

## 2022-12-07 DIAGNOSIS — R030 Elevated blood-pressure reading, without diagnosis of hypertension: Secondary | ICD-10-CM | POA: Diagnosis not present

## 2022-12-07 DIAGNOSIS — L97519 Non-pressure chronic ulcer of other part of right foot with unspecified severity: Secondary | ICD-10-CM | POA: Diagnosis not present

## 2022-12-07 DIAGNOSIS — G5792 Unspecified mononeuropathy of left lower limb: Secondary | ICD-10-CM | POA: Diagnosis not present

## 2022-12-07 DIAGNOSIS — I10 Essential (primary) hypertension: Secondary | ICD-10-CM | POA: Diagnosis not present

## 2022-12-07 DIAGNOSIS — M25512 Pain in left shoulder: Secondary | ICD-10-CM | POA: Diagnosis not present

## 2022-12-08 DIAGNOSIS — M25512 Pain in left shoulder: Secondary | ICD-10-CM | POA: Diagnosis not present

## 2022-12-16 ENCOUNTER — Ambulatory Visit (INDEPENDENT_AMBULATORY_CARE_PROVIDER_SITE_OTHER): Payer: Medicaid Other | Admitting: Podiatry

## 2022-12-16 DIAGNOSIS — E1159 Type 2 diabetes mellitus with other circulatory complications: Secondary | ICD-10-CM | POA: Diagnosis not present

## 2022-12-16 DIAGNOSIS — L97512 Non-pressure chronic ulcer of other part of right foot with fat layer exposed: Secondary | ICD-10-CM | POA: Diagnosis not present
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
## 2022-12-29 DIAGNOSIS — L97519 Non-pressure chronic ulcer of other part of right foot with unspecified severity: Secondary | ICD-10-CM | POA: Diagnosis not present

## 2022-12-29 DIAGNOSIS — I10 Essential (primary) hypertension: Secondary | ICD-10-CM | POA: Diagnosis not present

## 2022-12-29 DIAGNOSIS — Z6828 Body mass index (BMI) 28.0-28.9, adult: Secondary | ICD-10-CM | POA: Diagnosis not present

## 2022-12-29 DIAGNOSIS — G5792 Unspecified mononeuropathy of left lower limb: Secondary | ICD-10-CM | POA: Diagnosis not present

## 2022-12-29 DIAGNOSIS — M25512 Pain in left shoulder: Secondary | ICD-10-CM | POA: Diagnosis not present

## 2023-01-05 DIAGNOSIS — N898 Other specified noninflammatory disorders of vagina: Secondary | ICD-10-CM | POA: Diagnosis not present

## 2023-01-10 DIAGNOSIS — E042 Nontoxic multinodular goiter: Secondary | ICD-10-CM | POA: Diagnosis not present

## 2023-01-10 DIAGNOSIS — E1165 Type 2 diabetes mellitus with hyperglycemia: Secondary | ICD-10-CM | POA: Diagnosis not present

## 2023-01-14 ENCOUNTER — Telehealth: Payer: Self-pay | Admitting: Podiatry

## 2023-01-14 NOTE — Telephone Encounter (Signed)
Pt called to check on status of appt and then asked if Dr Posey Pronto had called in any medications for her from her last appt. I did not see anything called in or noted in the chart. She is scheduled to see Dr Posey Pronto 3.20.2024( next Wednesday)

## 2023-01-15 ENCOUNTER — Encounter (HOSPITAL_COMMUNITY): Payer: Self-pay

## 2023-01-15 ENCOUNTER — Ambulatory Visit (HOSPITAL_COMMUNITY)
Admission: EM | Admit: 2023-01-15 | Discharge: 2023-01-15 | Disposition: A | Discharge status: Home / Self Care | Payer: Medicaid Other | Attending: Emergency Medicine | Admitting: Emergency Medicine

## 2023-01-15 ENCOUNTER — Ambulatory Visit (HOSPITAL_COMMUNITY)
Admission: EM | Admit: 2023-01-15 | Discharge: 2023-01-15 | Disposition: A | Discharge status: Home / Self Care | Payer: Medicaid Other | Attending: Internal Medicine | Admitting: Internal Medicine

## 2023-01-15 DIAGNOSIS — R21 Rash and other nonspecific skin eruption: Secondary | ICD-10-CM

## 2023-01-15 DIAGNOSIS — M79602 Pain in left arm: Secondary | ICD-10-CM

## 2023-01-15 DIAGNOSIS — M79671 Pain in right foot: Secondary | ICD-10-CM

## 2023-01-15 DIAGNOSIS — E11628 Type 2 diabetes mellitus with other skin complications: Secondary | ICD-10-CM | POA: Diagnosis not present

## 2023-01-15 DIAGNOSIS — E1165 Type 2 diabetes mellitus with hyperglycemia: Secondary | ICD-10-CM | POA: Diagnosis not present

## 2023-01-15 LAB — CBG MONITORING, ED: Glucose-Capillary: 419 mg/dL — ABNORMAL HIGH (ref 70–99)

## 2023-01-15 MED ORDER — GLIMEPIRIDE 2 MG PO TABS: 2.0000 mg | ORAL_TABLET | ORAL | 0 refills | Status: AC

## 2023-01-15 MED ORDER — ACCU-CHEK SOFTCLIX LANCETS MISC: 0 refills | Status: AC

## 2023-01-15 MED ORDER — BACLOFEN 10 MG PO TABS: 5.0000 mg | ORAL_TABLET | Freq: Every evening | ORAL | 0 refills | Status: AC | PRN

## 2023-01-15 MED ORDER — MELOXICAM 7.5 MG PO TABS: 7.5000 mg | ORAL_TABLET | Freq: Every day | ORAL | 0 refills | Status: AC

## 2023-01-15 MED ORDER — TRIAMCINOLONE ACETONIDE 0.1 % EX CREA: 1.0000 | TOPICAL_CREAM | Freq: Two times a day (BID) | CUTANEOUS | 0 refills | Status: AC
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
## 2023-01-15 NOTE — Discharge Instructions (Addendum)
For your diabetes - Your glimepiride has been refilled for 2 weeks, take once every morning -Monitor blood sugar daily and record until seen by your doctor -Notify your endocrinologist that your medication is no longer being covered by her insurance so that they can provide further management -At any point if you begin to have difficulty breathing, increased fatigue, persistent vomiting your blood sugar is elevated greater than 350 please go to the nearest emergency department for immediate evaluation  For the rash to your skin - Unknown cause, no signs of infection at this time - Begin use of Kenalog cream every morning and every evening to help with itchiness and ideally help to reduce any tenderness and swelling -Follow-up with your primary doctor if your symptoms continue to persist  For your foot and arm pain -Low suspicion for break as you are able to complete movement, there is no swelling or bruising -You may use meloxicam every morning as needed to help with pain, may take this in addition to Tylenol -May use baclofen at nighttime as needed for additional comfort, be mindful this will make you feel drowsy -Use ice or heat over the affected area in 10 to 15-minute intervals -May prop arm and foot on 2 pillows for additional comfort and support -PLease follow-up with your primary doctor if your symptoms continue to persist

## 2023-01-15 NOTE — ED Provider Notes (Signed)
Patient presents to urgent care for recheck of blood sugar.  She was discharged from urgent care approximately 10 minutes ago however returns to have her blood sugar checked as she cannot find lancets at her house.  Requesting refill of lancets.  Refill sent to pharmacy.  She has type 2 diabetes that is uncontrolled.  She takes glipizide and stopped taking her Jardiance due to frequent urinary tract infection and vaginal yeast infections. Non fasting CBG 419 here. She recently ate breakfast and is not having any symptoms of hyperglycemia. No symptoms of urinary tract infection or vaginal symptoms.  Vital signs reviewed from visit 10 minutes ago and are hemodynamically stable.  Patient is overall well in appearance and neurologically intact to her baseline.  Afebrile.  Advised to follow-up with her primary care provider/endocrinologist on Monday.  She is agreeable with plan.  Discharged in stable condition.  Strict ER and urgent care return precautions discussed. Discussed dietary changes to prevent hyperglycemia.    Talbot Grumbling, [place] 01/15/23 1404
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
## 2023-01-15 NOTE — Discharge Instructions (Signed)
Please take your medications for your diabetes as directed.  Avoid foods that are high in sugar/carbohydrates.  I have included information on good foods to eat to keep your blood sugars well-regulated in your discharge packet.  Please schedule an appointment with your primary care provider to follow-up with them in the next 1 to 2 weeks to discuss your diabetes management further.  Continue to check your blood sugars at home.  Return to urgent care if you experience any new or worsening symptoms as needed.

## 2023-01-18 DIAGNOSIS — H26491 Other secondary cataract, right eye: Secondary | ICD-10-CM | POA: Diagnosis not present

## 2023-01-19 ENCOUNTER — Telehealth: Payer: Self-pay | Admitting: *Deleted

## 2023-01-19 ENCOUNTER — Ambulatory Visit (INDEPENDENT_AMBULATORY_CARE_PROVIDER_SITE_OTHER): Payer: Medicaid Other | Admitting: Podiatry

## 2023-01-19 DIAGNOSIS — L97512 Non-pressure chronic ulcer of other part of right foot with fat layer exposed: Secondary | ICD-10-CM

## 2023-01-19 DIAGNOSIS — M778 Other enthesopathies, not elsewhere classified: Secondary | ICD-10-CM | POA: Diagnosis not present

## 2023-01-19 DIAGNOSIS — E1159 Type 2 diabetes mellitus with other circulatory complications: Secondary | ICD-10-CM

## 2023-01-19 MED ORDER — SANTYL 250 UNIT/GM EX OINT: 1.0000 | TOPICAL_OINTMENT | Freq: Every day | CUTANEOUS | 0 refills | Status: DC
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
## 2023-01-19 NOTE — Telephone Encounter (Signed)
Pharmacy is calling to ask if they can increase the quantity of the Santyl to 30 gr, may be easier to get approval from insurance,please advise

## 2023-01-20 DIAGNOSIS — H35462 Secondary vitreoretinal degeneration, left eye: Secondary | ICD-10-CM | POA: Diagnosis not present

## 2023-01-20 DIAGNOSIS — E113493 Type 2 diabetes mellitus with severe nonproliferative diabetic retinopathy without macular edema, bilateral: Secondary | ICD-10-CM | POA: Diagnosis not present

## 2023-01-20 DIAGNOSIS — H31091 Other chorioretinal scars, right eye: Secondary | ICD-10-CM | POA: Diagnosis not present

## 2023-01-20 DIAGNOSIS — H43813 Vitreous degeneration, bilateral: Secondary | ICD-10-CM | POA: Diagnosis not present

## 2023-02-12 NOTE — Progress Notes (Signed)
Pt tolerated one BP check but did not want to do  third. Pt did not want to know results and did not want CBG check. Education about BP done. Pt needed information about PCPs in area. Phone number listed is different than in the chart [phone]

## 2023-02-21 ENCOUNTER — Encounter: Payer: Self-pay | Admitting: *Deleted

## 2023-02-22 NOTE — Progress Notes (Addendum)
Pt attended 02/12/23 screening event where her b/p was 178/99. During f/u call pt stated she was not feeling well at the event and did not want to know her b/p and blood sugar values so event results shared with her during call. Pt states she sees currently listed PCP in [place] who is her endocrinologist with whom she f/u at regular intervals. However, pt stated she would like to establish PCP in [facility] and also requested resources for food and housing; [employer] flyer and [facility] flyer as well as food pantry and housing resource lists mailed to pt. Pt given this callers phone number for additional support if needed.

## 2023-03-02 ENCOUNTER — Encounter: Payer: Self-pay | Admitting: Podiatry

## 2023-03-02 ENCOUNTER — Ambulatory Visit (INDEPENDENT_AMBULATORY_CARE_PROVIDER_SITE_OTHER): Payer: Medicaid Other

## 2023-03-02 ENCOUNTER — Ambulatory Visit (INDEPENDENT_AMBULATORY_CARE_PROVIDER_SITE_OTHER): Payer: Medicaid Other | Admitting: Podiatry

## 2023-03-02 DIAGNOSIS — L97512 Non-pressure chronic ulcer of other part of right foot with fat layer exposed: Secondary | ICD-10-CM

## 2023-03-02 DIAGNOSIS — E1159 Type 2 diabetes mellitus with other circulatory complications: Secondary | ICD-10-CM | POA: Diagnosis not present

## 2023-03-02 MED ORDER — SANTYL 250 UNIT/GM EX OINT: 1.0000 | TOPICAL_OINTMENT | Freq: Every day | CUTANEOUS | 0 refills | Status: AC

## 2023-03-02 MED ORDER — CYCLOBENZAPRINE HCL 10 MG PO TABS: 10.0000 mg | ORAL_TABLET | Freq: Three times a day (TID) | ORAL | 0 refills | Status: AC | PRN
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
## 2023-03-07 ENCOUNTER — Ambulatory Visit
Admission: EM | Admit: 2023-03-07 | Discharge: 2023-03-07 | Disposition: A | Discharge status: Home / Self Care | Payer: Medicaid Other | Attending: Internal Medicine | Admitting: Internal Medicine

## 2023-03-07 DIAGNOSIS — B354 Tinea corporis: Secondary | ICD-10-CM

## 2023-03-07 DIAGNOSIS — R8290 Unspecified abnormal findings in urine: Secondary | ICD-10-CM

## 2023-03-07 DIAGNOSIS — R200 Anesthesia of skin: Secondary | ICD-10-CM

## 2023-03-07 DIAGNOSIS — E119 Type 2 diabetes mellitus without complications: Secondary | ICD-10-CM | POA: Diagnosis not present

## 2023-03-07 DIAGNOSIS — R202 Paresthesia of skin: Secondary | ICD-10-CM

## 2023-03-07 DIAGNOSIS — I10 Essential (primary) hypertension: Secondary | ICD-10-CM | POA: Diagnosis not present

## 2023-03-07 LAB — POCT FASTING CBG KUC MANUAL ENTRY: POCT Glucose (KUC): 235 mg/dL — AB (ref 70–99)

## 2023-03-07 LAB — POCT URINALYSIS DIP (MANUAL ENTRY)
Bilirubin, UA: NEGATIVE
Glucose, UA: NEGATIVE mg/dL
Ketones, POC UA: NEGATIVE mg/dL
Nitrite, UA: NEGATIVE
Protein Ur, POC: 100 mg/dL — AB
Spec Grav, UA: >=1.03 — AB (ref 1.010–1.025)
Urobilinogen, UA: 0.2 E.U./dL
pH, UA: 6 (ref 5.0–8.0)

## 2023-03-07 MED ORDER — GABAPENTIN 300 MG PO CAPS: 300.0000 mg | ORAL_CAPSULE | Freq: Three times a day (TID) | ORAL | 0 refills | Status: AC

## 2023-03-07 MED ORDER — KETOCONAZOLE 2 % EX CREA: 1.0000 | TOPICAL_CREAM | Freq: Every day | CUTANEOUS | 0 refills | Status: AC
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
## 2023-03-07 NOTE — ED Triage Notes (Signed)
Pt c/o tingling to LLE x 1 hour-states she took compression sock off-with hx of same-also c/o elevated BS today "400" she took po meds-did not recheck BS- also c/o strong smelling urine x 1 week-NAD-steady gait

## 2023-03-07 NOTE — Discharge Instructions (Addendum)
For diabetes or elevated blood sugar, please make sure you are limiting and avoiding starchy, carbohydrate foods like pasta, breads, sweet breads, pastry, rice, potatoes, desserts. These foods can elevate your blood sugar. Also, limit and avoid drinks that contain a lot of sugar such as sodas, sweet teas, fruit juices.  Drinking plain water will be much more helpful, try 64 ounces of water daily.  It is okay to flavor your water naturally by cutting cucumber, lemon, mint or lime, placing it in a picture with water and drinking it over a period of 24-48 hours as long as it remains refrigerated.  For elevated blood pressure, make sure you are monitoring salt in your diet.  Do not eat restaurant foods and limit processed foods at home. I highly recommend you prepare and cook your own foods at home.  Processed foods include things like frozen meals, pre-seasoned meats and dinners, deli meats, canned foods as these foods contain a high amount of sodium/salt.  Make sure you are paying attention to sodium labels on foods you buy at the grocery store. Buy your spices separately such as garlic powder, onion powder, cumin, cayenne, parsley flakes so that you can avoid seasonings that contain salt. However, salt-free seasonings are available and can be used, an example is Mrs. Dash and includes a lot of different mixtures that do not contain salt.  Lastly, when cooking using oils that are healthier for you is important. This includes olive oil, avocado oil, canola oil. We have discussed a lot of foods to avoid but below is a list of foods that can be very healthy to use in your diet whether it is for diabetes, cholesterol, high blood pressure, or in general healthy eating.  Salads - kale, spinach, cabbage, spring mix, arugula Fruits - avocadoes, berries (blueberries, raspberries, blackberries), apples, oranges, pomegranate, grapefruit, kiwi Vegetables - asparagus, cauliflower, broccoli, green beans, brussel sprouts,  bell peppers, beets; stay away from or limit starchy vegetables like potatoes, carrots, peas Other general foods - kidney beans, egg whites, almonds, walnuts, sunflower seeds, pumpkin seeds, fat free yogurt, almond milk, flax seeds, quinoa, oats  Meat - It is better to eat lean meats and limit your red meat including pork to once a week.  Wild caught fish, chicken breast are good options as they tend to be leaner sources of good protein. Still be mindful of the sodium labels for the meats you buy.  DO NOT EAT ANY FOODS ON THIS LIST THAT YOU ARE ALLERGIC TO. For more specific needs, I highly recommend consulting a dietician or nutritionist but this can definitely be a good starting point.  

## 2023-03-09 ENCOUNTER — Telehealth (HOSPITAL_COMMUNITY): Payer: Self-pay

## 2023-03-09 LAB — URINE CULTURE: Culture: >=100000 — AB

## 2023-03-09 MED ORDER — NITROFURANTOIN MONOHYD MACRO 100 MG PO CAPS: 100.0000 mg | ORAL_CAPSULE | Freq: Two times a day (BID) | ORAL | 0 refills | Status: AC

## 2023-03-17 ENCOUNTER — Telehealth: Payer: Self-pay | Admitting: *Deleted

## 2023-03-17 NOTE — Telephone Encounter (Signed)
Referral to [facility], confirmation received.

## 2023-03-17 NOTE — Telephone Encounter (Signed)
-----   Message from Candelaria Stagers, DPM sent at 03/15/2023  1:45 PM EDT ----- Regarding: Referral to [facility] wound care center Hi Trino Higinbotham,  Can you refer this patient to [facility] wound care center.

## 2023-03-28 DIAGNOSIS — M7989 Other specified soft tissue disorders: Secondary | ICD-10-CM | POA: Diagnosis not present

## 2023-03-28 DIAGNOSIS — Z79899 Other long term (current) drug therapy: Secondary | ICD-10-CM | POA: Diagnosis not present

## 2023-03-28 DIAGNOSIS — I10 Essential (primary) hypertension: Secondary | ICD-10-CM | POA: Diagnosis not present

## 2023-03-29 DIAGNOSIS — S91101A Unspecified open wound of right great toe without damage to nail, initial encounter: Secondary | ICD-10-CM | POA: Diagnosis not present

## 2023-03-29 DIAGNOSIS — I10 Essential (primary) hypertension: Secondary | ICD-10-CM | POA: Diagnosis not present

## 2023-03-29 DIAGNOSIS — Z7982 Long term (current) use of aspirin: Secondary | ICD-10-CM | POA: Diagnosis not present

## 2023-03-29 DIAGNOSIS — E11621 Type 2 diabetes mellitus with foot ulcer: Secondary | ICD-10-CM | POA: Diagnosis not present

## 2023-03-29 DIAGNOSIS — L97511 Non-pressure chronic ulcer of other part of right foot limited to breakdown of skin: Secondary | ICD-10-CM | POA: Diagnosis not present

## 2023-03-29 DIAGNOSIS — S91301A Unspecified open wound, right foot, initial encounter: Secondary | ICD-10-CM | POA: Diagnosis not present

## 2023-03-29 DIAGNOSIS — E785 Hyperlipidemia, unspecified: Secondary | ICD-10-CM | POA: Diagnosis not present

## 2023-03-29 DIAGNOSIS — M2011 Hallux valgus (acquired), right foot: Secondary | ICD-10-CM | POA: Diagnosis not present

## 2023-03-29 DIAGNOSIS — Z7984 Long term (current) use of oral hypoglycemic drugs: Secondary | ICD-10-CM | POA: Diagnosis not present

## 2023-03-29 DIAGNOSIS — E1140 Type 2 diabetes mellitus with diabetic neuropathy, unspecified: Secondary | ICD-10-CM | POA: Diagnosis not present

## 2023-03-29 DIAGNOSIS — Z881 Allergy status to other antibiotic agents status: Secondary | ICD-10-CM | POA: Diagnosis not present

## 2023-03-30 DIAGNOSIS — L97511 Non-pressure chronic ulcer of other part of right foot limited to breakdown of skin: Secondary | ICD-10-CM | POA: Diagnosis not present

## 2023-03-30 DIAGNOSIS — E11621 Type 2 diabetes mellitus with foot ulcer: Secondary | ICD-10-CM | POA: Diagnosis not present

## 2023-04-19 ENCOUNTER — Encounter: Payer: Self-pay | Admitting: *Deleted

## 2023-04-19 NOTE — Progress Notes (Unsigned)
Pt attended 02/12/23 screening event where event RN noted that pt "tolerated one BP check but did not want to do third. Pt did not want to know results and did not want CBG check. Education about BP done". During initial event f/u pt stated she was not feeling well at the event so did not want her screening results at that time but did ask for them during the f/u call. At the time of that first call, pt shared she was using her [facility] end[facility]inologist as her PCP but was interested in establishing care with a local/[place] PCP. A letter was then sent to pt with [facility] PCP provider and clinic options.

## 2023-04-20 DIAGNOSIS — E049 Nontoxic goiter, unspecified: Secondary | ICD-10-CM | POA: Diagnosis not present

## 2023-04-20 DIAGNOSIS — E1165 Type 2 diabetes mellitus with hyperglycemia: Secondary | ICD-10-CM | POA: Diagnosis not present

## 2023-04-25 DIAGNOSIS — Z7984 Long term (current) use of oral hypoglycemic drugs: Secondary | ICD-10-CM | POA: Diagnosis not present

## 2023-04-25 DIAGNOSIS — Z7982 Long term (current) use of aspirin: Secondary | ICD-10-CM | POA: Diagnosis not present

## 2023-04-25 DIAGNOSIS — E11621 Type 2 diabetes mellitus with foot ulcer: Secondary | ICD-10-CM | POA: Diagnosis not present

## 2023-04-25 DIAGNOSIS — L97511 Non-pressure chronic ulcer of other part of right foot limited to breakdown of skin: Secondary | ICD-10-CM | POA: Diagnosis not present

## 2023-04-25 DIAGNOSIS — Z881 Allergy status to other antibiotic agents status: Secondary | ICD-10-CM | POA: Diagnosis not present

## 2023-04-25 DIAGNOSIS — E785 Hyperlipidemia, unspecified: Secondary | ICD-10-CM | POA: Diagnosis not present

## 2023-04-25 DIAGNOSIS — M2011 Hallux valgus (acquired), right foot: Secondary | ICD-10-CM | POA: Diagnosis not present

## 2023-04-25 DIAGNOSIS — I10 Essential (primary) hypertension: Secondary | ICD-10-CM | POA: Diagnosis not present

## 2023-04-25 DIAGNOSIS — E1140 Type 2 diabetes mellitus with diabetic neuropathy, unspecified: Secondary | ICD-10-CM | POA: Diagnosis not present

## 2023-05-09 DIAGNOSIS — E11621 Type 2 diabetes mellitus with foot ulcer: Secondary | ICD-10-CM | POA: Diagnosis not present

## 2023-05-09 DIAGNOSIS — L97511 Non-pressure chronic ulcer of other part of right foot limited to breakdown of skin: Secondary | ICD-10-CM | POA: Diagnosis not present

## 2023-05-09 DIAGNOSIS — M869 Osteomyelitis, unspecified: Secondary | ICD-10-CM | POA: Diagnosis not present

## 2023-05-09 DIAGNOSIS — S91101D Unspecified open wound of right great toe without damage to nail, subsequent encounter: Secondary | ICD-10-CM | POA: Diagnosis not present

## 2023-06-01 DIAGNOSIS — E11621 Type 2 diabetes mellitus with foot ulcer: Secondary | ICD-10-CM | POA: Diagnosis not present

## 2023-06-01 DIAGNOSIS — L97511 Non-pressure chronic ulcer of other part of right foot limited to breakdown of skin: Secondary | ICD-10-CM | POA: Diagnosis not present

## 2023-06-01 DIAGNOSIS — S91101D Unspecified open wound of right great toe without damage to nail, subsequent encounter: Secondary | ICD-10-CM | POA: Diagnosis not present

## 2023-06-01 DIAGNOSIS — S91101A Unspecified open wound of right great toe without damage to nail, initial encounter: Secondary | ICD-10-CM | POA: Diagnosis not present

## 2023-06-15 DIAGNOSIS — E11621 Type 2 diabetes mellitus with foot ulcer: Secondary | ICD-10-CM | POA: Diagnosis not present

## 2023-07-26 DIAGNOSIS — E1165 Type 2 diabetes mellitus with hyperglycemia: Secondary | ICD-10-CM | POA: Diagnosis not present

## 2023-07-26 DIAGNOSIS — E042 Nontoxic multinodular goiter: Secondary | ICD-10-CM | POA: Diagnosis not present

## 2023-08-17 DIAGNOSIS — E785 Hyperlipidemia, unspecified: Secondary | ICD-10-CM | POA: Diagnosis not present

## 2023-08-17 DIAGNOSIS — Z7982 Long term (current) use of aspirin: Secondary | ICD-10-CM | POA: Diagnosis not present

## 2023-08-17 DIAGNOSIS — L97511 Non-pressure chronic ulcer of other part of right foot limited to breakdown of skin: Secondary | ICD-10-CM | POA: Diagnosis not present

## 2023-08-17 DIAGNOSIS — S91302A Unspecified open wound, left foot, initial encounter: Secondary | ICD-10-CM | POA: Diagnosis not present

## 2023-08-17 DIAGNOSIS — L97519 Non-pressure chronic ulcer of other part of right foot with unspecified severity: Secondary | ICD-10-CM | POA: Diagnosis not present

## 2023-08-17 DIAGNOSIS — Z882 Allergy status to sulfonamides status: Secondary | ICD-10-CM | POA: Diagnosis not present

## 2023-08-17 DIAGNOSIS — L97421 Non-pressure chronic ulcer of left heel and midfoot limited to breakdown of skin: Secondary | ICD-10-CM | POA: Diagnosis not present

## 2023-08-17 DIAGNOSIS — Z91199 Patient's noncompliance with other medical treatment and regimen due to unspecified reason: Secondary | ICD-10-CM | POA: Diagnosis not present

## 2023-08-17 DIAGNOSIS — Z91018 Allergy to other foods: Secondary | ICD-10-CM | POA: Diagnosis not present

## 2023-08-17 DIAGNOSIS — Z79899 Other long term (current) drug therapy: Secondary | ICD-10-CM | POA: Diagnosis not present

## 2023-08-17 DIAGNOSIS — Z7984 Long term (current) use of oral hypoglycemic drugs: Secondary | ICD-10-CM | POA: Diagnosis not present

## 2023-08-17 DIAGNOSIS — Z881 Allergy status to other antibiotic agents status: Secondary | ICD-10-CM | POA: Diagnosis not present

## 2023-08-17 DIAGNOSIS — L97529 Non-pressure chronic ulcer of other part of left foot with unspecified severity: Secondary | ICD-10-CM | POA: Diagnosis not present

## 2023-08-17 DIAGNOSIS — Z888 Allergy status to other drugs, medicaments and biological substances status: Secondary | ICD-10-CM | POA: Diagnosis not present

## 2023-08-17 DIAGNOSIS — Z7985 Long-term (current) use of injectable non-insulin antidiabetic drugs: Secondary | ICD-10-CM | POA: Diagnosis not present

## 2023-08-17 DIAGNOSIS — E11621 Type 2 diabetes mellitus with foot ulcer: Secondary | ICD-10-CM | POA: Diagnosis not present

## 2023-08-17 DIAGNOSIS — Z9109 Other allergy status, other than to drugs and biological substances: Secondary | ICD-10-CM | POA: Diagnosis not present

## 2023-08-17 DIAGNOSIS — I10 Essential (primary) hypertension: Secondary | ICD-10-CM | POA: Diagnosis not present

## 2023-08-17 DIAGNOSIS — M722 Plantar fascial fibromatosis: Secondary | ICD-10-CM | POA: Diagnosis not present

## 2023-08-17 DIAGNOSIS — S91101A Unspecified open wound of right great toe without damage to nail, initial encounter: Secondary | ICD-10-CM | POA: Diagnosis not present

## 2023-09-06 DIAGNOSIS — E11621 Type 2 diabetes mellitus with foot ulcer: Secondary | ICD-10-CM | POA: Diagnosis not present

## 2023-09-06 DIAGNOSIS — L97511 Non-pressure chronic ulcer of other part of right foot limited to breakdown of skin: Secondary | ICD-10-CM | POA: Diagnosis not present

## 2023-09-06 DIAGNOSIS — S91101D Unspecified open wound of right great toe without damage to nail, subsequent encounter: Secondary | ICD-10-CM | POA: Diagnosis not present

## 2023-09-22 ENCOUNTER — Emergency Department (HOSPITAL_BASED_OUTPATIENT_CLINIC_OR_DEPARTMENT_OTHER)
Admission: EM | Admit: 2023-09-22 | Discharge: 2023-09-22 | Disposition: A | Discharge status: Home / Self Care | Payer: Medicaid Other | Attending: Emergency Medicine | Admitting: Emergency Medicine

## 2023-09-22 ENCOUNTER — Emergency Department (HOSPITAL_BASED_OUTPATIENT_CLINIC_OR_DEPARTMENT_OTHER): Payer: Medicaid Other

## 2023-09-22 ENCOUNTER — Other Ambulatory Visit: Payer: Self-pay

## 2023-09-22 ENCOUNTER — Encounter (HOSPITAL_BASED_OUTPATIENT_CLINIC_OR_DEPARTMENT_OTHER): Payer: Self-pay

## 2023-09-22 DIAGNOSIS — H5711 Ocular pain, right eye: Secondary | ICD-10-CM | POA: Diagnosis not present

## 2023-09-22 DIAGNOSIS — E1159 Type 2 diabetes mellitus with other circulatory complications: Secondary | ICD-10-CM

## 2023-09-22 DIAGNOSIS — Z760 Encounter for issue of repeat prescription: Secondary | ICD-10-CM | POA: Diagnosis not present

## 2023-09-22 DIAGNOSIS — H16101 Unspecified superficial keratitis, right eye: Secondary | ICD-10-CM | POA: Diagnosis not present

## 2023-09-22 DIAGNOSIS — H5461 Unqualified visual loss, right eye, normal vision left eye: Secondary | ICD-10-CM

## 2023-09-22 DIAGNOSIS — Z961 Presence of intraocular lens: Secondary | ICD-10-CM | POA: Diagnosis not present

## 2023-09-22 DIAGNOSIS — H04121 Dry eye syndrome of right lacrimal gland: Secondary | ICD-10-CM | POA: Diagnosis not present

## 2023-09-22 DIAGNOSIS — H16001 Unspecified corneal ulcer, right eye: Secondary | ICD-10-CM | POA: Diagnosis not present

## 2023-09-22 DIAGNOSIS — Z7982 Long term (current) use of aspirin: Secondary | ICD-10-CM | POA: Diagnosis not present

## 2023-09-22 DIAGNOSIS — E119 Type 2 diabetes mellitus without complications: Secondary | ICD-10-CM | POA: Diagnosis not present

## 2023-09-22 DIAGNOSIS — I672 Cerebral atherosclerosis: Secondary | ICD-10-CM | POA: Diagnosis not present

## 2023-09-22 DIAGNOSIS — E113393 Type 2 diabetes mellitus with moderate nonproliferative diabetic retinopathy without macular edema, bilateral: Secondary | ICD-10-CM | POA: Diagnosis not present

## 2023-09-22 DIAGNOSIS — H538 Other visual disturbances: Secondary | ICD-10-CM | POA: Diagnosis present

## 2023-09-22 HISTORY — DX: Unspecified osteoarthritis, unspecified site: M19.90

## 2023-09-22 HISTORY — DX: Plantar fascial fibromatosis: M72.2

## 2023-09-22 MED ORDER — HYDROCHLOROTHIAZIDE 25 MG PO TABS: 25.0000 mg | ORAL_TABLET | Freq: Every day | ORAL | 0 refills | Status: AC

## 2023-09-22 MED ORDER — FLUORESCEIN SODIUM 1 MG OP STRP
1.0000 | ORAL_STRIP | Freq: Once | OPHTHALMIC | Status: AC
  Administered 2023-09-22: 1 via OPHTHALMIC
  Filled 2023-09-22: qty 1

## 2023-09-22 MED ORDER — TETRACAINE HCL 0.5 % OP SOLN
2.0000 [drp] | Freq: Once | OPHTHALMIC | Status: AC
  Administered 2023-09-22: 2 [drp] via OPHTHALMIC
  Filled 2023-09-22: qty 4

## 2023-09-22 MED ORDER — MOXIFLOXACIN HCL 0.5 % OP SOLN: 1.0000 [drp] | Freq: Three times a day (TID) | OPHTHALMIC | 0 refills | Status: AC

## 2023-09-22 NOTE — ED Triage Notes (Addendum)
Pt reports RT eye pain and it "feels like something is stuck in it". Pain is constant. Pt reports it began 1 hour ago. Pt endorses blurry vision. Pt also ran out of hydrochlorothiazide 25 mg daily BP meds. Pt also has floaters in her eyes. Daughter states she thinks pt also has brain fog and it has been ongoing for 2-3 years. Pt took 1 gram tylenol PTA.

## 2023-09-22 NOTE — ED Provider Notes (Signed)
[place] [facility] AT [facility] Provider Note   CSN: [id number] Arrival date & time: 09/22/23  0349     History  Chief Complaint  Patient presents with   Eye Problem    Kathleen Mcmillan is a 57 y.o. female.  The history is provided by the patient.  Eye Problem Location:  Right eye Severity:  Severe Onset quality:  Sudden Duration:  2 hours Timing:  Constant Progression:  Unchanged Chronicity:  New Context: not contact lens problem and not direct trauma   Relieved by:  Nothing Associated symptoms: no discharge, no itching and no redness   Patient with complex medical history including cataract surgery this year at [facility] presents with sensation of FB in the Right eye and sudden loss of vision in the right eye as well as floaters in the right eye.  She has also been off her blood pressure medicine for an unknown period of time.       Home Medications Prior to Admission medications   Medication Sig Start Date End Date Taking? Authorizing Provider  Accu-Chek Softclix Lancets lancets Use as directed. 01/15/23   Carlisle Beers, FNP  acetaminophen (TYLENOL) 500 MG tablet Take 1,000 mg by mouth every 6 (six) hours as needed for mild pain.    [provider]  aspirin 81 MG chewable tablet Chew 1 tablet (81 mg total) by mouth daily. 07/04/22   Meredeth Ide, MD  atorvastatin (LIPITOR) 40 MG tablet Take 1 tablet (40 mg total) by mouth daily. 07/03/22   Meredeth Ide, MD  baclofen (LIORESAL) 10 MG tablet Take 0.5 tablets (5 mg total) by mouth at bedtime as needed for muscle spasms. 01/15/23   Valinda Hoar, NP  Blood Glucose Monitoring Suppl (ACCU-CHEK GUIDE) w/Device KIT Use as directed 04/07/21   Elease Etienne, MD  Calcium Carbonate-Simethicone (ALKA-SELTZER HEARTBURN + GAS) 750-80 MG CHEW Chew 1 tablet by mouth daily as needed (heartburn).    [provider]  Chlorphen-Phenyleph-ASA (ALKA-SELTZER PLUS COLD)  2-7.8-325 MG TBEF Take 1-2 tablets by mouth every 6 (six) hours as needed (cold symptoms).    [provider]  clotrimazole (LOTRIMIN) 1 % cream Apply 1 Application topically 2 (two) times daily. 06/12/22   Allwardt, Crist Infante, PA-C  collagenase (SANTYL) 250 UNIT/GM ointment Apply 1 Application topically daily. 03/02/23   Candelaria Stagers, DPM  cyclobenzaprine (FLEXERIL) 10 MG tablet Take 1 tablet (10 mg total) by mouth 3 (three) times daily as needed for muscle spasms. 03/02/23   Candelaria Stagers, DPM  Diclofenac Sodium 1 % CREA Apply 2 g topically 4 (four) times daily as needed (arm pain). Apply to left upper arm as needed 11/26/22   Radford Pax, NP  empagliflozin (JARDIANCE) 25 MG TABS tablet Take by mouth daily.    [provider]  gabapentin (NEURONTIN) 300 MG capsule Take 1 capsule (300 mg total) by mouth 3 (three) times daily. 03/07/23   Wallis Bamberg, PA-C  glimepiride (AMARYL) 2 MG tablet Take 1 tablet (2 mg total) by mouth every morning for 14 days. 01/15/23 01/29/23  Valinda Hoar, NP  glipiZIDE (GLUCOTROL XL) 10 MG 24 hr tablet Take 1 tablet by mouth daily. 01/18/23   [provider]  glucose blood (ACCU-CHEK GUIDE) test strip Use as instructed up to 4 times daily 04/07/21   Elease Etienne, MD  hydrochlorothiazide (HYDRODIURIL) 25 MG tablet Take 1 tablet (25 mg total) by mouth daily for 14  days. Take one tablet daily 11/26/22 12/10/22  Radford Pax, NP  ketoconazole (NIZORAL) 2 % cream Apply 1 Application topically daily. 03/07/23   Wallis Bamberg, PA-C  leptospermum manuka honey (MEDIHONEY) PSTE paste Apply 1 Application topically daily. Apply to right great toe chronic wound after cleansing. Top with dry gauze, secure with conform bandaging, paper tape. Apply thin layer (3 mm) to wound. 07/04/22   Meredeth Ide, MD  levofloxacin (LEVAQUIN) 750 MG tablet Take by mouth.    [provider]  meloxicam (MOBIC) 7.5 MG tablet Take 1 tablet (7.5 mg total) by mouth daily. 01/15/23    Valinda Hoar, NP  Multiple Vitamin (MULTIVITAMIN) capsule Take 1 capsule by mouth daily.    [provider]  nitrofurantoin, macrocrystal-monohydrate, (MACROBID) 100 MG capsule Take 1 capsule (100 mg total) by mouth 2 (two) times daily. 03/09/23   Merrilee Jansky, MD  omeprazole (PRILOSEC) 20 MG capsule Take 20 mg by mouth daily as needed (acid reflux).    [provider]  triamcinolone cream (KENALOG) 0.1 % Apply 1 Application topically 2 (two) times daily. 01/15/23   Valinda Hoar, NP      Allergies    Egg-derived products, Mango flavor, Tetracycline, Tetracyclines & related, Demeclocycline, Other, and Tape    Review of Systems   Review of Systems  Constitutional:  Negative for fever.  HENT:  Negative for facial swelling.   Eyes:  Positive for visual disturbance. Negative for discharge, redness and itching.  All other systems reviewed and are negative.   Physical Exam Updated Vital Signs BP (!) 168/108   Pulse 95   Temp 97.7 F (36.5 C) (Oral)   Resp 16   Ht 5\' 6"  (1.676 m)   Wt 90.2 kg   SpO2 99%   BMI 32.09 kg/m  Physical Exam Vitals and nursing note reviewed. Exam conducted with a chaperone present.  Constitutional:      General: She is not in acute distress.    Appearance: She is well-developed.  HENT:     Head: Normocephalic and atraumatic.     Nose: Nose normal.  Eyes:     General: Lids are normal. Lids are everted, no foreign bodies appreciated.     Intraocular pressure: Right eye pressure is 20 mmHg.     Extraocular Movements: Extraocular movements intact.     Right eye: Normal extraocular motion.     Conjunctiva/sclera:     Right eye: No chemosis, exudate or hemorrhage.    Pupils: Pupils are equal, round, and reactive to light.      Comments:   Visual Acuity  Right Eye Distance: (S) Unable to see any letter Left Eye Distance: 20/30 Bilateral Distance: 20/30  Right Eye Near: unable    Left Eye Near:    Bilateral Near:       Cardiovascular:     Rate and Rhythm: Normal rate and regular rhythm.     Pulses: Normal pulses.     Heart sounds: Normal heart sounds.  Pulmonary:     Effort: Pulmonary effort is normal. No respiratory distress.     Breath sounds: Normal breath sounds.  Abdominal:     General: Bowel sounds are normal. There is no distension.     Palpations: Abdomen is soft.     Tenderness: There is no abdominal tenderness. There is no guarding or rebound.  Musculoskeletal:        General: Normal range of motion.     Cervical back: Neck  supple.  Skin:    General: Skin is dry.     Capillary Refill: Capillary refill takes less than 2 seconds.     Findings: No erythema or rash.  Neurological:     General: No focal deficit present.     Deep Tendon Reflexes: Reflexes normal.  Psychiatric:        Mood and Affect: Mood normal.      Visual Acuity  Right Eye Distance: (S) Unable to see any letter Left Eye Distance: 20/30 Bilateral Distance: 20/30  Right Eye Near:   Left Eye Near:    Bilateral Near:     ED Results / Procedures / Treatments   Labs (all labs ordered are listed, but only abnormal results are displayed) Labs Reviewed - No data to display  EKG None  Radiology CT Orbits Wo Contrast  Result Date: 09/22/2023 CLINICAL DATA:  57 year old female with right eye pain, blurred vision "floaters". Diabetes and previous cataract surgery. EXAM: CT ORBITS WITHOUT CONTRAST TECHNIQUE: Multidetector CT imaging of the orbits was performed using the standard protocol without intravenous contrast. Multiplanar CT image reconstructions were also generated. RADIATION DOSE REDUCTION: This exam was performed according to the departmental dose-optimization program which includes automated exposure control, adjustment of the mA and/or kV according to patient size and/or use of iterative reconstruction technique. COMPARISON:  Brain MRI, CTA head and neck 07/03/2022. FINDINGS: Orbits: Intact orbital walls.  Chronic postoperative changes to both globes, stable. Noncontrast intraorbital soft tissues appear symmetric, stable, and normal. No evidence of intraorbital mass or inflammation. Superficial periorbital soft tissues appears symmetric and normal. Visible paranasal sinuses: Visualized paranasal sinuses and mastoids are stable and well aerated. Soft tissues: Negative visible noncontrast masticator, parapharyngeal and parotid spaces. Negative visible nasopharynx. Visualized scalp soft tissues are within normal limits. Osseous: Visible facial bones and calvarium appear intact. Normal bone mineralization. No acute maxillary dental finding is evident. Limited intracranial: Calcified atherosclerosis at the skull base. Stable and negative visible noncontrast brain parenchyma. IMPRESSION: Stable and normal noncontrast CT appearance of the orbits, status post cataract surgery. Electronically Signed   By: Odessa Fleming M.D.   On: 09/22/2023 05:18    Procedures Procedures    Medications Ordered in ED Medications  tetracaine (PONTOCAINE) 0.5 % ophthalmic solution 2 drop (2 drops Right Eye Given by Other 09/22/23 0522)  fluorescein ophthalmic strip 1 strip (1 strip Right Eye Given 09/22/23 0522)    ED Course/ Medical Decision Making/ A&P                                 Medical Decision Making Patient with sudden loss of vision as well as FB sensation and floaters, sees [facility]   Amount and/or Complexity of Data Reviewed Independent Historian:     Details: Relative see above  External Data Reviewed: notes.    Details: Previous notes reviewed  Radiology: ordered and independent interpretation performed.    Details: Negative CT orbits  Discussion of management or test interpretation with external provider(s): 548 case d/w Dr. Antony Contras of [facility] ophthalmology.  Start Vigamox drops and have patient come to the office this am at 8 am doors open at 815  Risk Prescription drug  management. Risk Details: Symptoms consistent with retinal detachment vs. Vitreous detachment and corneal ulceration.  Needs to see ophthalmology emergently.  Patient instructed to start antibiotic eye drops immediately and then go to Dr. Randon Goldsmith' office to be see first  thing this am.  Patient verbalizes understanding and agrees to follow up.  I have provided BP medication until patient can be seen by her PMD for discussion about ongoing blood pressure medication.  Stable for discharge     Final Clinical Impression(s) / ED Diagnoses Final diagnoses:  None   Return for intractable cough, coughing up blood, fevers > 100.4 unrelieved by medication, shortness of breath, intractable vomiting, chest pain, shortness of breath, weakness, numbness, changes in speech, facial asymmetry, abdominal pain, passing out, Inability to tolerate liquids or food, cough, altered mental status or any concerns. No signs of systemic illness or infection. The patient is nontoxic-appearing on exam and vital signs are within normal limits.  I have reviewed the triage vital signs and the nursing notes. Pertinent labs & imaging results that were available during my care of the patient were reviewed by me and considered in my medical decision making (see chart for details). After history, exam, and medical workup I feel the patient has been appropriately medically screened and is safe for discharge home. Pertinent diagnoses were discussed with the patient. Patient was given return precautions.  Rx / DC Orders ED Discharge Orders     None         Jimia Gentles, MD 09/22/23 1610

## 2023-10-16 DIAGNOSIS — R5383 Other fatigue: Secondary | ICD-10-CM | POA: Diagnosis not present

## 2023-10-16 DIAGNOSIS — I10 Essential (primary) hypertension: Secondary | ICD-10-CM | POA: Diagnosis not present

## 2023-10-16 DIAGNOSIS — E559 Vitamin D deficiency, unspecified: Secondary | ICD-10-CM | POA: Diagnosis not present

## 2023-10-16 DIAGNOSIS — E119 Type 2 diabetes mellitus without complications: Secondary | ICD-10-CM | POA: Diagnosis not present

## 2023-10-16 DIAGNOSIS — Z1159 Encounter for screening for other viral diseases: Secondary | ICD-10-CM | POA: Diagnosis not present

## 2023-10-16 DIAGNOSIS — Z1231 Encounter for screening mammogram for malignant neoplasm of breast: Secondary | ICD-10-CM | POA: Diagnosis not present

## 2023-10-16 DIAGNOSIS — Z6832 Body mass index (BMI) 32.0-32.9, adult: Secondary | ICD-10-CM | POA: Diagnosis not present

## 2023-10-16 DIAGNOSIS — Z0000 Encounter for general adult medical examination without abnormal findings: Secondary | ICD-10-CM | POA: Diagnosis not present

## 2023-10-16 DIAGNOSIS — E6609 Other obesity due to excess calories: Secondary | ICD-10-CM | POA: Diagnosis not present

## 2023-10-16 DIAGNOSIS — D539 Nutritional anemia, unspecified: Secondary | ICD-10-CM | POA: Diagnosis not present

## 2023-10-16 DIAGNOSIS — Z124 Encounter for screening for malignant neoplasm of cervix: Secondary | ICD-10-CM | POA: Diagnosis not present

## 2023-10-21 DIAGNOSIS — E042 Nontoxic multinodular goiter: Secondary | ICD-10-CM | POA: Diagnosis not present

## 2023-10-29 DIAGNOSIS — E119 Type 2 diabetes mellitus without complications: Secondary | ICD-10-CM | POA: Diagnosis not present

## 2023-10-29 DIAGNOSIS — M79671 Pain in right foot: Secondary | ICD-10-CM | POA: Diagnosis not present

## 2023-10-29 DIAGNOSIS — E1129 Type 2 diabetes mellitus with other diabetic kidney complication: Secondary | ICD-10-CM | POA: Diagnosis not present

## 2023-10-29 DIAGNOSIS — Z6832 Body mass index (BMI) 32.0-32.9, adult: Secondary | ICD-10-CM | POA: Diagnosis not present

## 2023-10-29 DIAGNOSIS — M79672 Pain in left foot: Secondary | ICD-10-CM | POA: Diagnosis not present

## 2023-10-29 DIAGNOSIS — M722 Plantar fascial fibromatosis: Secondary | ICD-10-CM | POA: Diagnosis not present

## 2023-10-29 DIAGNOSIS — I10 Essential (primary) hypertension: Secondary | ICD-10-CM | POA: Diagnosis not present

## 2023-10-29 DIAGNOSIS — E7800 Pure hypercholesterolemia, unspecified: Secondary | ICD-10-CM | POA: Diagnosis not present

## 2023-10-29 DIAGNOSIS — R809 Proteinuria, unspecified: Secondary | ICD-10-CM | POA: Diagnosis not present

## 2023-10-29 DIAGNOSIS — E6609 Other obesity due to excess calories: Secondary | ICD-10-CM | POA: Diagnosis not present

## 2023-10-29 DIAGNOSIS — E559 Vitamin D deficiency, unspecified: Secondary | ICD-10-CM | POA: Diagnosis not present

## 2023-11-01 ENCOUNTER — Emergency Department (HOSPITAL_COMMUNITY): Payer: Medicaid Other

## 2023-11-01 ENCOUNTER — Encounter (HOSPITAL_COMMUNITY): Payer: Self-pay

## 2023-11-01 ENCOUNTER — Emergency Department (HOSPITAL_COMMUNITY)
Admission: EM | Admit: 2023-11-01 | Discharge: 2023-11-01 | Discharge status: Against Medical Advice | Payer: Medicaid Other | Attending: Emergency Medicine | Admitting: Emergency Medicine

## 2023-11-01 ENCOUNTER — Other Ambulatory Visit: Payer: Self-pay

## 2023-11-01 DIAGNOSIS — R002 Palpitations: Secondary | ICD-10-CM | POA: Diagnosis not present

## 2023-11-01 DIAGNOSIS — Z5321 Procedure and treatment not carried out due to patient leaving prior to being seen by health care provider: Secondary | ICD-10-CM | POA: Diagnosis not present

## 2023-11-01 DIAGNOSIS — R079 Chest pain, unspecified: Secondary | ICD-10-CM | POA: Diagnosis not present

## 2023-11-01 DIAGNOSIS — I10 Essential (primary) hypertension: Secondary | ICD-10-CM | POA: Diagnosis not present

## 2023-11-01 LAB — CBC
HCT: 36.1 % (ref 36.0–46.0)
Hemoglobin: 11 g/dL — ABNORMAL LOW (ref 12.0–15.0)
MCH: 22.9 pg — ABNORMAL LOW (ref 26.0–34.0)
MCHC: 30.5 g/dL (ref 30.0–36.0)
MCV: 75.1 fL — ABNORMAL LOW (ref 80.0–100.0)
Platelets: 312 10*3/uL (ref 150–400)
RBC: 4.81 MIL/uL (ref 3.87–5.11)
RDW: 15 % (ref 11.5–15.5)
WBC: 6.1 10*3/uL (ref 4.0–10.5)
nRBC: 0 % (ref 0.0–0.2)

## 2023-11-01 LAB — BASIC METABOLIC PANEL
Anion gap: 6 (ref 5–15)
BUN: 22 mg/dL — ABNORMAL HIGH (ref 6–20)
CO2: 26 mmol/L (ref 22–32)
Calcium: 9 mg/dL (ref 8.9–10.3)
Chloride: 103 mmol/L (ref 98–111)
Creatinine, Ser: 1.04 mg/dL — ABNORMAL HIGH (ref 0.44–1.00)
GFR, Estimated: >60 mL/min (ref >60)
Glucose, Bld: 169 mg/dL — ABNORMAL HIGH (ref 70–99)
Potassium: 3.9 mmol/L (ref 3.5–5.1)
Sodium: 135 mmol/L (ref 135–145)

## 2023-11-01 LAB — TROPONIN I (HIGH SENSITIVITY): Troponin I (High Sensitivity): 4 ng/L (ref <18)

## 2023-11-01 NOTE — ED Triage Notes (Signed)
 Pt came to ed for palpitations since last night. HTN. Has not taken HTN meds. axox4

## 2023-11-01 NOTE — ED Provider Triage Note (Signed)
 Emergency Medicine Provider Triage Evaluation Note  Kathleen Mcmillan , a 56 y.o. female  was evaluated in triage.  Pt complains of palpitations.  Woke up this AM with palpitations, heart beating fast. Improved then returned. No chest pain or dyspnea. No fever, cough, nausea, vomiting, black or bloody stools. No syncope or dizziness.  Under sent workup with thyroid  ultrasound.  Review of Systems  Positive: palpitations Negative: See above  Physical Exam  BP (!) 193/108 (BP Location: Left Arm)   Pulse 94   Temp 97.9 F (36.6 C)   Resp 17   Ht 5' 6 (1.676 m)   Wt 90.7 kg   SpO2 100%   BMI 32.28 kg/m  Gen:   Awake, no distress   Resp:  Normal effort  MSK:   Moves extremities without difficulty  Other:  Normal distal pulses  Medical Decision Making  Medically screening exam initiated at 3:28 PM.  Appropriate orders placed.  Kathleen Mcmillan was informed that the remainder of the evaluation will be completed by another provider, this initial triage assessment does not replace that evaluation, and the importance of remaining in the ED until their evaluation is complete.  Symptoms may be consistent with transient arrhythmia prior to arrival--labs ordered include CBC, BMP, TSH T4, troponin and cxr.   Dreama Longs, MD 11/01/23 1535

## 2023-11-01 NOTE — ED Notes (Signed)
Pt called for vital signs and troponin draw, no answer.

## 2023-11-01 NOTE — ED Notes (Signed)
Pt called for vitals again, no answer.

## 2023-11-14 DIAGNOSIS — E1129 Type 2 diabetes mellitus with other diabetic kidney complication: Secondary | ICD-10-CM | POA: Diagnosis not present

## 2023-12-27 DIAGNOSIS — E1129 Type 2 diabetes mellitus with other diabetic kidney complication: Secondary | ICD-10-CM | POA: Diagnosis not present

## 2023-12-27 DIAGNOSIS — R809 Proteinuria, unspecified: Secondary | ICD-10-CM | POA: Diagnosis not present

## 2023-12-27 DIAGNOSIS — Z6832 Body mass index (BMI) 32.0-32.9, adult: Secondary | ICD-10-CM | POA: Diagnosis not present

## 2023-12-27 DIAGNOSIS — M722 Plantar fascial fibromatosis: Secondary | ICD-10-CM | POA: Diagnosis not present

## 2023-12-27 DIAGNOSIS — E6609 Other obesity due to excess calories: Secondary | ICD-10-CM | POA: Diagnosis not present

## 2023-12-27 DIAGNOSIS — E119 Type 2 diabetes mellitus without complications: Secondary | ICD-10-CM | POA: Diagnosis not present

## 2023-12-27 DIAGNOSIS — E7800 Pure hypercholesterolemia, unspecified: Secondary | ICD-10-CM | POA: Diagnosis not present

## 2023-12-27 DIAGNOSIS — E559 Vitamin D deficiency, unspecified: Secondary | ICD-10-CM | POA: Diagnosis not present

## 2023-12-27 DIAGNOSIS — I10 Essential (primary) hypertension: Secondary | ICD-10-CM | POA: Diagnosis not present

## 2023-12-27 DIAGNOSIS — S91109A Unspecified open wound of unspecified toe(s) without damage to nail, initial encounter: Secondary | ICD-10-CM | POA: Diagnosis not present

## 2023-12-30 DIAGNOSIS — E049 Nontoxic goiter, unspecified: Secondary | ICD-10-CM | POA: Diagnosis not present

## 2023-12-30 DIAGNOSIS — E1165 Type 2 diabetes mellitus with hyperglycemia: Secondary | ICD-10-CM | POA: Diagnosis not present

## 2024-01-11 ENCOUNTER — Encounter (HOSPITAL_COMMUNITY): Payer: Self-pay | Admitting: Emergency Medicine

## 2024-01-11 ENCOUNTER — Emergency Department (HOSPITAL_COMMUNITY)
Admission: EM | Admit: 2024-01-11 | Discharge: 2024-01-11 | Disposition: A | Discharge status: Home / Self Care | Attending: Emergency Medicine | Admitting: Emergency Medicine

## 2024-01-11 ENCOUNTER — Other Ambulatory Visit: Payer: Self-pay

## 2024-01-11 DIAGNOSIS — Z79899 Other long term (current) drug therapy: Secondary | ICD-10-CM | POA: Diagnosis not present

## 2024-01-11 DIAGNOSIS — R739 Hyperglycemia, unspecified: Secondary | ICD-10-CM | POA: Diagnosis present

## 2024-01-11 DIAGNOSIS — E1165 Type 2 diabetes mellitus with hyperglycemia: Secondary | ICD-10-CM | POA: Diagnosis not present

## 2024-01-11 DIAGNOSIS — Z7982 Long term (current) use of aspirin: Secondary | ICD-10-CM | POA: Insufficient documentation

## 2024-01-11 DIAGNOSIS — I10 Essential (primary) hypertension: Secondary | ICD-10-CM | POA: Diagnosis not present

## 2024-01-11 LAB — COMPREHENSIVE METABOLIC PANEL
ALT: 15 U/L (ref 0–44)
AST: 15 U/L (ref 15–41)
Albumin: 3.5 g/dL (ref 3.5–5.0)
Alkaline Phosphatase: 86 U/L (ref 38–126)
Anion gap: 7 (ref 5–15)
BUN: 32 mg/dL — ABNORMAL HIGH (ref 6–20)
CO2: 27 mmol/L (ref 22–32)
Calcium: 8.9 mg/dL (ref 8.9–10.3)
Chloride: 100 mmol/L (ref 98–111)
Creatinine, Ser: 1.23 mg/dL — ABNORMAL HIGH (ref 0.44–1.00)
GFR, Estimated: 51 mL/min — ABNORMAL LOW (ref >60)
Glucose, Bld: 283 mg/dL — ABNORMAL HIGH (ref 70–99)
Potassium: 4.1 mmol/L (ref 3.5–5.1)
Sodium: 134 mmol/L — ABNORMAL LOW (ref 135–145)
Total Bilirubin: 0.6 mg/dL (ref 0.0–1.2)
Total Protein: 7.7 g/dL (ref 6.5–8.1)

## 2024-01-11 LAB — CBC
HCT: 36.8 % (ref 36.0–46.0)
Hemoglobin: 11.4 g/dL — ABNORMAL LOW (ref 12.0–15.0)
MCH: 23.1 pg — ABNORMAL LOW (ref 26.0–34.0)
MCHC: 31 g/dL (ref 30.0–36.0)
MCV: 74.5 fL — ABNORMAL LOW (ref 80.0–100.0)
Platelets: 351 10*3/uL (ref 150–400)
RBC: 4.94 MIL/uL (ref 3.87–5.11)
RDW: 14.5 % (ref 11.5–15.5)
WBC: 6.9 10*3/uL (ref 4.0–10.5)
nRBC: 0 % (ref 0.0–0.2)

## 2024-01-11 LAB — URINALYSIS, ROUTINE W REFLEX MICROSCOPIC
Bacteria, UA: NONE SEEN
Bilirubin Urine: NEGATIVE
Glucose, UA: 50 mg/dL — AB
Ketones, ur: NEGATIVE mg/dL
Leukocytes,Ua: NEGATIVE
Nitrite: NEGATIVE
Protein, ur: 100 mg/dL — AB
Specific Gravity, Urine: 1.009 (ref 1.005–1.030)
pH: 7 (ref 5.0–8.0)

## 2024-01-11 LAB — CBG MONITORING, ED
Glucose-Capillary: 254 mg/dL — ABNORMAL HIGH (ref 70–99)
Glucose-Capillary: 246 mg/dL — ABNORMAL HIGH (ref 70–99)

## 2024-01-11 MED ORDER — AMLODIPINE BESYLATE 5 MG PO TABS
5.0000 mg | ORAL_TABLET | Freq: Once | ORAL | Status: AC
  Administered 2024-01-11: 5 mg via ORAL
  Filled 2024-01-11: qty 1

## 2024-01-11 MED ORDER — AMLODIPINE BESYLATE 10 MG PO TABS: 10.0000 mg | ORAL_TABLET | Freq: Every day | ORAL | 1 refills | Status: AC

## 2024-01-11 MED ORDER — HYDROCHLOROTHIAZIDE 12.5 MG PO CAPS: 12.5000 mg | ORAL_CAPSULE | Freq: Once | ORAL | Status: DC

## 2024-01-11 MED ORDER — SODIUM CHLORIDE 0.9 % IV BOLUS
1000.0000 mL | Freq: Once | INTRAVENOUS | Status: AC
  Administered 2024-01-11: 1000 mL via INTRAVENOUS

## 2024-01-11 MED ORDER — HYDROCHLOROTHIAZIDE 12.5 MG PO TABS
25.0000 mg | ORAL_TABLET | Freq: Once | ORAL | Status: AC
  Administered 2024-01-11: 25 mg via ORAL
  Filled 2024-01-11: qty 2

## 2024-01-11 NOTE — ED Triage Notes (Signed)
 BIBA Per EMS: Pt coming from home w/ c/o hyperglycemia. Overall generally feeling weak. Had medication changes recently.  290 CBG

## 2024-01-11 NOTE — ED Provider Notes (Signed)
 [place] EMERGENCY DEPARTMENT AT [facility] Provider Note   CSN: [id number] Arrival date & time: 01/11/24  0100     History {Add pertinent medical, surgical, social history, OB history to HPI:1} Chief Complaint  Patient presents with   Hyperglycemia    Kathleen Mcmillan is a 58 y.o. female.  HPI    This is a 58 year old female who presents with not feeling right.  Patient reports that she feels like her blood pressure and her blood sugar have been higher than normal.  She recently changed how she took her medication and now takes it altogether.  She is unsure with this is made a difference.  States that she has noticed some floaters which is not abnormal for her.  She reports some lightheadedness but no recent fevers.  No chest pain, shortness of breath, abdominal pain, nausea, vomiting.  Home Medications Prior to Admission medications   Medication Sig Start Date End Date Taking? Authorizing Provider  Accu-Chek Softclix Lancets lancets Use as directed. 01/15/23   Carlisle Beers, FNP  acetaminophen (TYLENOL) 500 MG tablet Take 1,000 mg by mouth every 6 (six) hours as needed for mild pain.    [provider]  aspirin 81 MG chewable tablet Chew 1 tablet (81 mg total) by mouth daily. 07/04/22   Meredeth Ide, MD  atorvastatin (LIPITOR) 40 MG tablet Take 1 tablet (40 mg total) by mouth daily. 07/03/22   Meredeth Ide, MD  baclofen (LIORESAL) 10 MG tablet Take 0.5 tablets (5 mg total) by mouth at bedtime as needed for muscle spasms. 01/15/23   Valinda Hoar, NP  Blood Glucose Monitoring Suppl (ACCU-CHEK GUIDE) w/Device KIT Use as directed 04/07/21   Elease Etienne, MD  Calcium Carbonate-Simethicone (ALKA-SELTZER HEARTBURN + GAS) 750-80 MG CHEW Chew 1 tablet by mouth daily as needed (heartburn).    [provider]  Chlorphen-Phenyleph-ASA (ALKA-SELTZER PLUS COLD) 2-7.8-325 MG TBEF Take 1-2 tablets by mouth every 6 (six) hours as needed (cold symptoms).     [provider]  clotrimazole (LOTRIMIN) 1 % cream Apply 1 Application topically 2 (two) times daily. 06/12/22   Allwardt, Crist Infante, PA-C  collagenase (SANTYL) 250 UNIT/GM ointment Apply 1 Application topically daily. 03/02/23   Candelaria Stagers, DPM  cyclobenzaprine (FLEXERIL) 10 MG tablet Take 1 tablet (10 mg total) by mouth 3 (three) times daily as needed for muscle spasms. 03/02/23   Candelaria Stagers, DPM  Diclofenac Sodium 1 % CREA Apply 2 g topically 4 (four) times daily as needed (arm pain). Apply to left upper arm as needed 11/26/22   Radford Pax, NP  empagliflozin (JARDIANCE) 25 MG TABS tablet Take by mouth daily.    [provider]  gabapentin (NEURONTIN) 300 MG capsule Take 1 capsule (300 mg total) by mouth 3 (three) times daily. 03/07/23   Wallis Bamberg, PA-C  glimepiride (AMARYL) 2 MG tablet Take 1 tablet (2 mg total) by mouth every morning for 14 days. 01/15/23 01/29/23  Valinda Hoar, NP  glipiZIDE (GLUCOTROL XL) 10 MG 24 hr tablet Take 1 tablet by mouth daily. 01/18/23   [provider]  glucose blood (ACCU-CHEK GUIDE) test strip Use as instructed up to 4 times daily 04/07/21   Elease Etienne, MD  hydrochlorothiazide (HYDRODIURIL) 25 MG tablet Take 1 tablet (25 mg total) by mouth daily for 14 days. Take one tablet daily 09/22/23 10/06/23  Palumbo, April, MD  ketoconazole (NIZORAL) 2 % cream Apply 1 Application  topically daily. 03/07/23   Wallis Bamberg, PA-C  leptospermum manuka honey (MEDIHONEY) PSTE paste Apply 1 Application topically daily. Apply to right great toe chronic wound after cleansing. Top with dry gauze, secure with conform bandaging, paper tape. Apply thin layer (3 mm) to wound. 07/04/22   Meredeth Ide, MD  levofloxacin (LEVAQUIN) 750 MG tablet Take by mouth.    [provider]  meloxicam (MOBIC) 7.5 MG tablet Take 1 tablet (7.5 mg total) by mouth daily. 01/15/23   White, Elita Boone, NP  moxifloxacin (VIGAMOX) 0.5 % ophthalmic solution Place 1 drop  into the right eye 3 (three) times daily. 09/22/23   Palumbo, April, MD  Multiple Vitamin (MULTIVITAMIN) capsule Take 1 capsule by mouth daily.    [provider]  nitrofurantoin, macrocrystal-monohydrate, (MACROBID) 100 MG capsule Take 1 capsule (100 mg total) by mouth 2 (two) times daily. 03/09/23   Merrilee Jansky, MD  omeprazole (PRILOSEC) 20 MG capsule Take 20 mg by mouth daily as needed (acid reflux).    [provider]  triamcinolone cream (KENALOG) 0.1 % Apply 1 Application topically 2 (two) times daily. 01/15/23   Valinda Hoar, NP      Allergies    Egg-derived products, Mango flavoring agent (non-screening), Tetracycline, Tetracyclines & related, Demeclocycline, Other, and Tape    Review of Systems   Review of Systems  Constitutional:  Negative for fever.  Eyes:  Positive for visual disturbance.  Respiratory:  Negative for shortness of breath.   Cardiovascular:  Negative for chest pain.  Gastrointestinal:  Negative for abdominal pain, nausea and vomiting.  Neurological:  Positive for light-headedness.  All other systems reviewed and are negative.   Physical Exam Updated Vital Signs BP (!) 200/109   Pulse (!) 103   Temp 98.1 F (36.7 C) (Oral)   Resp 18   SpO2 99%  Physical Exam Vitals and nursing note reviewed.  Constitutional:      Appearance: She is well-developed. She is obese. She is not ill-appearing.  HENT:     Head: Normocephalic and atraumatic.  Eyes:     Pupils: Pupils are equal, round, and reactive to light.     Comments: Visual acuity grossly intact  Cardiovascular:     Rate and Rhythm: Normal rate and regular rhythm.     Heart sounds: Normal heart sounds.  Pulmonary:     Effort: Pulmonary effort is normal. No respiratory distress.     Breath sounds: No wheezing.  Abdominal:     General: Bowel sounds are normal.     Palpations: Abdomen is soft.     Tenderness: There is no abdominal tenderness. There is no guarding or rebound.   Musculoskeletal:     Cervical back: Neck supple.  Skin:    General: Skin is warm and dry.  Neurological:     Mental Status: She is alert and oriented to person, place, and time.  Psychiatric:        Mood and Affect: Mood normal.     ED Results / Procedures / Treatments   Labs (all labs ordered are listed, but only abnormal results are displayed) Labs Reviewed  CBC - Abnormal; Notable for the following components:      Result Value   Hemoglobin 11.4 (*)    MCV 74.5 (*)    MCH 23.1 (*)    All other components within normal limits  COMPREHENSIVE METABOLIC PANEL - Abnormal; Notable for the following components:   Sodium 134 (*)  Glucose, Bld 283 (*)    BUN 32 (*)    Creatinine, Ser 1.23 (*)    GFR, Estimated 51 (*)    All other components within normal limits  CBG MONITORING, ED - Abnormal; Notable for the following components:   Glucose-Capillary 254 (*)    All other components within normal limits  CBG MONITORING, ED - Abnormal; Notable for the following components:   Glucose-Capillary 246 (*)    All other components within normal limits  RESP PANEL BY RT-PCR (RSV, FLU A&B, COVID)  RVPGX2  URINALYSIS, ROUTINE W REFLEX MICROSCOPIC    EKG None  Radiology No results found.  Procedures Procedures  {Document cardiac monitor, telemetry assessment procedure when appropriate:1}  Medications Ordered in ED Medications  sodium chloride 0.9 % bolus 1,000 mL (1,000 mLs Intravenous New Bag/Given 01/11/24 0602)    ED Course/ Medical Decision Making/ A&P   {   Click here for ABCD2, HEART and other calculatorsREFRESH Note before signing :1}                              Medical Decision Making Amount and/or Complexity of Data Reviewed Labs: ordered.   ***  {Document critical care time when appropriate:1} {Document review of labs and clinical decision tools ie heart score, Chads2Vasc2 etc:1}  {Document your independent review of radiology images, and any outside  records:1} {Document your discussion with family members, caretakers, and with consultants:1} {Document social determinants of health affecting pt's care:1} {Document your decision making why or why not admission, treatments were needed:1} Final Clinical Impression(s) / ED Diagnoses Final diagnoses:  None    Rx / DC Orders ED Discharge Orders     None

## 2024-01-11 NOTE — ED Notes (Signed)
Pt aware of need for urine specimen. 

## 2024-01-11 NOTE — ED Notes (Signed)
 Pt refused resp swab. RN made aware

## 2024-01-11 NOTE — ED Provider Notes (Signed)
 Patient signed out to me at 07 30 by Dr. Wilkie Aye pending EKG, viral swab and reassessment.  In short this is a 58 year old female with a past medical history of hypertension and diabetes that presented to the emergency department with dizziness and nausea with concerns that her blood sugar and blood pressure were high.  Patient was hypertensive to 194/112 on her arrival and blood sugar was 283.  Creatinine is approximately at her baseline.  EKG was performed that showed normal sinus rhythm without acute ischemic changes.  She has received a liter of fluids and her home blood pressure medicines and her blood pressures slowly starting to improve.  8:43 AM Upon reassessment, the patient reports improvement of her symptoms.  Her heart is regular rate and rhythm and lungs are clear to auscultation bilaterally.  She is no focal neurologic deficits on exam.  Reviewing her last outpatient visit her blood pressure was also in the 190s and will be recommended to increase her home amlodipine.  She states she is currently on amlodipine and losartan for blood pressure at home.  Recommended close primary care follow-up for blood pressure recheck and was given strict return precautions.   Rexford Maus, DO 01/11/24 [phone]

## 2024-01-11 NOTE — Discharge Instructions (Signed)
 You were seen in the emergency department for your dizziness, high blood pressure and high blood sugar.  Your blood sugar and blood pressure were mildly elevated here but you had no signs of complication from your sugar or blood pressure being high.  We gave you some fluids and your blood pressure medication and both your blood pressure and blood sugar did start to improve.  I would recommend that you increase your amlodipine to 10 mg daily in addition to taking your home losartan.  You can check your blood pressure at home and I recommend checking it at the same time every day when you been resting for at least 15 minutes and keep a log of your pressures.  You can bring this to your primary doctor and should follow-up within the next week to have your blood pressure and blood sugar rechecked.  You should return to the emergency department if you develop severe headaches or chest pain, you have numbness or weakness on one side the body compared to the other or any other new or concerning symptoms.

## 2024-01-11 NOTE — ED Notes (Signed)
 Pt refused covid swab.

## 2024-01-16 DIAGNOSIS — S91101D Unspecified open wound of right great toe without damage to nail, subsequent encounter: Secondary | ICD-10-CM | POA: Diagnosis not present

## 2024-01-16 DIAGNOSIS — E1165 Type 2 diabetes mellitus with hyperglycemia: Secondary | ICD-10-CM | POA: Diagnosis not present

## 2024-01-16 DIAGNOSIS — L97511 Non-pressure chronic ulcer of other part of right foot limited to breakdown of skin: Secondary | ICD-10-CM | POA: Diagnosis not present

## 2024-01-16 DIAGNOSIS — E11621 Type 2 diabetes mellitus with foot ulcer: Secondary | ICD-10-CM | POA: Diagnosis not present

## 2024-01-23 DIAGNOSIS — M21962 Unspecified acquired deformity of left lower leg: Secondary | ICD-10-CM | POA: Diagnosis not present

## 2024-01-23 DIAGNOSIS — M792 Neuralgia and neuritis, unspecified: Secondary | ICD-10-CM | POA: Diagnosis not present

## 2024-01-23 DIAGNOSIS — M722 Plantar fascial fibromatosis: Secondary | ICD-10-CM | POA: Diagnosis not present

## 2024-01-23 DIAGNOSIS — E1151 Type 2 diabetes mellitus with diabetic peripheral angiopathy without gangrene: Secondary | ICD-10-CM | POA: Diagnosis not present

## 2024-01-23 DIAGNOSIS — M869 Osteomyelitis, unspecified: Secondary | ICD-10-CM | POA: Diagnosis not present

## 2024-01-23 DIAGNOSIS — M21961 Unspecified acquired deformity of right lower leg: Secondary | ICD-10-CM | POA: Diagnosis not present

## 2024-01-24 DIAGNOSIS — I10 Essential (primary) hypertension: Secondary | ICD-10-CM | POA: Diagnosis not present

## 2024-01-24 DIAGNOSIS — E119 Type 2 diabetes mellitus without complications: Secondary | ICD-10-CM | POA: Diagnosis not present

## 2024-01-24 DIAGNOSIS — D539 Nutritional anemia, unspecified: Secondary | ICD-10-CM | POA: Diagnosis not present

## 2024-01-24 DIAGNOSIS — R5383 Other fatigue: Secondary | ICD-10-CM | POA: Diagnosis not present

## 2024-01-24 DIAGNOSIS — M722 Plantar fascial fibromatosis: Secondary | ICD-10-CM | POA: Diagnosis not present

## 2024-01-24 DIAGNOSIS — R413 Other amnesia: Secondary | ICD-10-CM | POA: Diagnosis not present

## 2024-01-24 DIAGNOSIS — L219 Seborrheic dermatitis, unspecified: Secondary | ICD-10-CM | POA: Diagnosis not present

## 2024-01-24 DIAGNOSIS — E669 Obesity, unspecified: Secondary | ICD-10-CM | POA: Diagnosis not present

## 2024-01-24 DIAGNOSIS — E559 Vitamin D deficiency, unspecified: Secondary | ICD-10-CM | POA: Diagnosis not present

## 2024-01-24 DIAGNOSIS — E7800 Pure hypercholesterolemia, unspecified: Secondary | ICD-10-CM | POA: Diagnosis not present

## 2024-01-24 DIAGNOSIS — E6609 Other obesity due to excess calories: Secondary | ICD-10-CM | POA: Diagnosis not present

## 2024-02-21 DIAGNOSIS — M21962 Unspecified acquired deformity of left lower leg: Secondary | ICD-10-CM | POA: Diagnosis not present

## 2024-02-21 DIAGNOSIS — E1151 Type 2 diabetes mellitus with diabetic peripheral angiopathy without gangrene: Secondary | ICD-10-CM | POA: Diagnosis not present

## 2024-02-21 DIAGNOSIS — M869 Osteomyelitis, unspecified: Secondary | ICD-10-CM | POA: Diagnosis not present

## 2024-02-21 DIAGNOSIS — M21961 Unspecified acquired deformity of right lower leg: Secondary | ICD-10-CM | POA: Diagnosis not present

## 2024-02-21 DIAGNOSIS — M722 Plantar fascial fibromatosis: Secondary | ICD-10-CM | POA: Diagnosis not present

## 2024-02-21 DIAGNOSIS — M792 Neuralgia and neuritis, unspecified: Secondary | ICD-10-CM | POA: Diagnosis not present

## 2024-03-20 DIAGNOSIS — M792 Neuralgia and neuritis, unspecified: Secondary | ICD-10-CM | POA: Diagnosis not present

## 2024-03-20 DIAGNOSIS — E1151 Type 2 diabetes mellitus with diabetic peripheral angiopathy without gangrene: Secondary | ICD-10-CM | POA: Diagnosis not present

## 2024-03-20 DIAGNOSIS — M722 Plantar fascial fibromatosis: Secondary | ICD-10-CM | POA: Diagnosis not present

## 2024-03-20 DIAGNOSIS — M21961 Unspecified acquired deformity of right lower leg: Secondary | ICD-10-CM | POA: Diagnosis not present

## 2024-03-20 DIAGNOSIS — M21962 Unspecified acquired deformity of left lower leg: Secondary | ICD-10-CM | POA: Diagnosis not present

## 2024-03-20 DIAGNOSIS — M869 Osteomyelitis, unspecified: Secondary | ICD-10-CM | POA: Diagnosis not present

## 2024-04-23 DIAGNOSIS — M722 Plantar fascial fibromatosis: Secondary | ICD-10-CM | POA: Diagnosis not present

## 2024-04-23 DIAGNOSIS — E1151 Type 2 diabetes mellitus with diabetic peripheral angiopathy without gangrene: Secondary | ICD-10-CM | POA: Diagnosis not present

## 2024-04-23 DIAGNOSIS — M792 Neuralgia and neuritis, unspecified: Secondary | ICD-10-CM | POA: Diagnosis not present

## 2024-04-23 DIAGNOSIS — M21961 Unspecified acquired deformity of right lower leg: Secondary | ICD-10-CM | POA: Diagnosis not present

## 2024-04-23 DIAGNOSIS — M21962 Unspecified acquired deformity of left lower leg: Secondary | ICD-10-CM | POA: Diagnosis not present

## 2024-04-23 DIAGNOSIS — M869 Osteomyelitis, unspecified: Secondary | ICD-10-CM | POA: Diagnosis not present

## 2024-04-25 DIAGNOSIS — E7800 Pure hypercholesterolemia, unspecified: Secondary | ICD-10-CM | POA: Diagnosis not present

## 2024-04-25 DIAGNOSIS — M25561 Pain in right knee: Secondary | ICD-10-CM | POA: Diagnosis not present

## 2024-04-25 DIAGNOSIS — M79605 Pain in left leg: Secondary | ICD-10-CM | POA: Diagnosis not present

## 2024-04-25 DIAGNOSIS — R5383 Other fatigue: Secondary | ICD-10-CM | POA: Diagnosis not present

## 2024-04-25 DIAGNOSIS — I10 Essential (primary) hypertension: Secondary | ICD-10-CM | POA: Diagnosis not present

## 2024-04-25 DIAGNOSIS — M25562 Pain in left knee: Secondary | ICD-10-CM | POA: Diagnosis not present

## 2024-04-25 DIAGNOSIS — D539 Nutritional anemia, unspecified: Secondary | ICD-10-CM | POA: Diagnosis not present

## 2024-04-25 DIAGNOSIS — M545 Low back pain, unspecified: Secondary | ICD-10-CM | POA: Diagnosis not present

## 2024-04-25 DIAGNOSIS — E559 Vitamin D deficiency, unspecified: Secondary | ICD-10-CM | POA: Diagnosis not present

## 2024-04-25 DIAGNOSIS — E119 Type 2 diabetes mellitus without complications: Secondary | ICD-10-CM | POA: Diagnosis not present

## 2024-04-25 DIAGNOSIS — M25552 Pain in left hip: Secondary | ICD-10-CM | POA: Diagnosis not present

## 2024-05-02 ENCOUNTER — Encounter (HOSPITAL_COMMUNITY): Payer: Self-pay

## 2024-05-02 ENCOUNTER — Emergency Department (HOSPITAL_COMMUNITY)

## 2024-05-02 ENCOUNTER — Other Ambulatory Visit: Payer: Self-pay

## 2024-05-02 ENCOUNTER — Emergency Department (HOSPITAL_COMMUNITY)
Admission: EM | Admit: 2024-05-02 | Discharge: 2024-05-02 | Disposition: A | Discharge status: Home / Self Care | Source: Ambulatory Visit | Attending: Emergency Medicine | Admitting: Emergency Medicine

## 2024-05-02 DIAGNOSIS — S0990XA Unspecified injury of head, initial encounter: Secondary | ICD-10-CM | POA: Diagnosis not present

## 2024-05-02 DIAGNOSIS — Z7984 Long term (current) use of oral hypoglycemic drugs: Secondary | ICD-10-CM | POA: Diagnosis not present

## 2024-05-02 DIAGNOSIS — Z79899 Other long term (current) drug therapy: Secondary | ICD-10-CM | POA: Diagnosis not present

## 2024-05-02 DIAGNOSIS — I672 Cerebral atherosclerosis: Secondary | ICD-10-CM | POA: Diagnosis not present

## 2024-05-02 DIAGNOSIS — Z7982 Long term (current) use of aspirin: Secondary | ICD-10-CM | POA: Diagnosis not present

## 2024-05-02 DIAGNOSIS — I10 Essential (primary) hypertension: Secondary | ICD-10-CM | POA: Diagnosis not present

## 2024-05-02 DIAGNOSIS — E119 Type 2 diabetes mellitus without complications: Secondary | ICD-10-CM | POA: Diagnosis not present

## 2024-05-02 DIAGNOSIS — I161 Hypertensive emergency: Secondary | ICD-10-CM | POA: Diagnosis not present

## 2024-05-02 DIAGNOSIS — H5711 Ocular pain, right eye: Secondary | ICD-10-CM | POA: Diagnosis not present

## 2024-05-02 DIAGNOSIS — Z8673 Personal history of transient ischemic attack (TIA), and cerebral infarction without residual deficits: Secondary | ICD-10-CM | POA: Insufficient documentation

## 2024-05-02 DIAGNOSIS — H538 Other visual disturbances: Secondary | ICD-10-CM | POA: Diagnosis not present

## 2024-05-02 DIAGNOSIS — H539 Unspecified visual disturbance: Secondary | ICD-10-CM | POA: Diagnosis not present

## 2024-05-02 LAB — CBC WITH DIFFERENTIAL/PLATELET
Abs Immature Granulocytes: 0.02 10*3/uL (ref 0.00–0.07)
Basophils Absolute: 0 10*3/uL (ref 0.0–0.1)
Basophils Relative: 0 %
Eosinophils Absolute: 0 10*3/uL (ref 0.0–0.5)
Eosinophils Relative: 0 %
HCT: 37 % (ref 36.0–46.0)
Hemoglobin: 11.2 g/dL — ABNORMAL LOW (ref 12.0–15.0)
Immature Granulocytes: 0 %
Lymphocytes Relative: 32 %
Lymphs Abs: 2.5 10*3/uL (ref 0.7–4.0)
MCH: 22.7 pg — ABNORMAL LOW (ref 26.0–34.0)
MCHC: 30.3 g/dL (ref 30.0–36.0)
MCV: 74.9 fL — ABNORMAL LOW (ref 80.0–100.0)
Monocytes Absolute: 0.6 10*3/uL (ref 0.1–1.0)
Monocytes Relative: 8 %
Neutro Abs: 4.5 10*3/uL (ref 1.7–7.7)
Neutrophils Relative %: 60 %
Platelets: 320 10*3/uL (ref 150–400)
RBC: 4.94 MIL/uL (ref 3.87–5.11)
RDW: 14.9 % (ref 11.5–15.5)
WBC: 7.6 10*3/uL (ref 4.0–10.5)
nRBC: 0 % (ref 0.0–0.2)

## 2024-05-02 LAB — COMPREHENSIVE METABOLIC PANEL WITH GFR
ALT: 17 U/L (ref 0–44)
AST: 20 U/L (ref 15–41)
Albumin: 3.4 g/dL — ABNORMAL LOW (ref 3.5–5.0)
Alkaline Phosphatase: 71 U/L (ref 38–126)
Anion gap: 10 (ref 5–15)
BUN: 28 mg/dL — ABNORMAL HIGH (ref 6–20)
CO2: 28 mmol/L (ref 22–32)
Calcium: 9.2 mg/dL (ref 8.9–10.3)
Chloride: 99 mmol/L (ref 98–111)
Creatinine, Ser: 1.18 mg/dL — ABNORMAL HIGH (ref 0.44–1.00)
GFR, Estimated: 53 mL/min — ABNORMAL LOW (ref >60)
Glucose, Bld: 220 mg/dL — ABNORMAL HIGH (ref 70–99)
Potassium: 4 mmol/L (ref 3.5–5.1)
Sodium: 137 mmol/L (ref 135–145)
Total Bilirubin: 0.8 mg/dL (ref 0.0–1.2)
Total Protein: 7.6 g/dL (ref 6.5–8.1)

## 2024-05-02 MED ORDER — FLUORESCEIN SODIUM 1 MG OP STRP
1.0000 | ORAL_STRIP | Freq: Once | OPHTHALMIC | Status: AC
  Administered 2024-05-02: 1 via OPHTHALMIC
  Filled 2024-05-02: qty 1

## 2024-05-02 MED ORDER — TETRACAINE HCL 0.5 % OP SOLN
2.0000 [drp] | Freq: Once | OPHTHALMIC | Status: AC
  Administered 2024-05-02: 2 [drp] via OPHTHALMIC
  Filled 2024-05-02: qty 4

## 2024-05-02 MED ORDER — OFLOXACIN 0.3 % OP SOLN
1.0000 [drp] | Freq: Once | OPHTHALMIC | Status: DC
  Filled 2024-05-02: qty 5

## 2024-05-02 MED ORDER — AMLODIPINE BESYLATE 5 MG PO TABS
10.0000 mg | ORAL_TABLET | Freq: Once | ORAL | Status: AC
  Administered 2024-05-02: 10 mg via ORAL
  Filled 2024-05-02: qty 2

## 2024-05-02 NOTE — Progress Notes (Signed)
 Kathleen Mcmillan is a 58 y.o. female Presents with right eye blurriness and floaters. Onset 2 days.

## 2024-05-02 NOTE — ED Triage Notes (Signed)
 Pt to er, pt states that she thinks that she bumped her eye with a sheet Sunday or Monday morning, states that the vision in her R eye is blurry, states that she also cut her finger and bumped her head yesterday.

## 2024-05-02 NOTE — ED Provider Notes (Signed)
 [place] EMERGENCY DEPARTMENT AT [facility] Provider Note   CSN: [id number] Arrival date & time: 05/02/24  1358     Patient presents with: Eye Pain   Kathleen Mcmillan is a 58 y.o. female TIA, HTN, T2DM, cataracts, s/p cataract surgery 2024 presents emergency department for evaluation of decreased vision, floaters in right eye for past two days.  She was evaluated for this similarly on 09/22/2023. She has neg CT orbits and was provided Vigamox  drops and urgent ophtho f/u. Unfortunately, she does not recall what diagnosis, recommendation was provided and I am unable to see this on chart review. Does not wear contacts  BP ranging from 182-200 SBP.  Normally takes amlodipine  10 mg daily, losartan 25 mg daily.  She believes that she took her losartan today but is unsure if she took amlodipine  today.  She does not take her blood pressure at home.  Saw [facility] primary care provider last week but is unsure what her blood pressure was then.  Also reports that she was cleaning up around the house and bumped her head yesterday but is unsure how around what.  She believes that when she was changing the sheets that that she might of hit her right eye.  She denies LOC, headaches, fevers  Initially, she was evaluated urgent care who recommended ED evaluation due to high blood pressure, vision loss.  HPI limited as pt is a very poor historian with specific information    Eye Pain       Prior to Admission medications   Medication Sig Start Date End Date Taking? Authorizing Provider  Accu-Chek Softclix Lancets lancets Use as directed. 01/15/23   Enedelia Dorna HERO, FNP  acetaminophen  (TYLENOL ) 500 MG tablet Take 1,000 mg by mouth every 6 (six) hours as needed for mild pain.    [provider]  amLODipine  (NORVASC ) 10 MG tablet Take 1 tablet (10 mg total) by mouth daily. 01/11/24 02/10/24  Kingsley, Victoria K, DO  aspirin  81 MG chewable tablet Chew 1 tablet (81 mg total)  by mouth daily. 07/04/22   Drusilla Sabas RAMAN, MD  atorvastatin  (LIPITOR) 40 MG tablet Take 1 tablet (40 mg total) by mouth daily. 07/03/22   Drusilla Sabas RAMAN, MD  baclofen  (LIORESAL ) 10 MG tablet Take 0.5 tablets (5 mg total) by mouth at bedtime as needed for muscle spasms. 01/15/23   Teresa Shelba SAUNDERS, NP  Blood Glucose Monitoring Suppl (ACCU-CHEK GUIDE) w/Device KIT Use as directed 04/07/21   Hongalgi, Anand D, MD  Calcium  Carbonate-Simethicone (ALKA-SELTZER HEARTBURN + GAS) 750-80 MG CHEW Chew 1 tablet by mouth daily as needed (heartburn).    [provider]  Chlorphen-Phenyleph-ASA (ALKA-SELTZER PLUS COLD) 2-7.8-325 MG TBEF Take 1-2 tablets by mouth every 6 (six) hours as needed (cold symptoms).    [provider]  clotrimazole  (LOTRIMIN ) 1 % cream Apply 1 Application topically 2 (two) times daily. 06/12/22   Allwardt, Alyssa M, PA-C  collagenase  (SANTYL ) 250 UNIT/GM ointment Apply 1 Application topically daily. 03/02/23   Tobie Franky SQUIBB, DPM  cyclobenzaprine  (FLEXERIL ) 10 MG tablet Take 1 tablet (10 mg total) by mouth 3 (three) times daily as needed for muscle spasms. 03/02/23   Tobie Franky SQUIBB, DPM  Diclofenac  Sodium 1 % CREA Apply 2 g topically 4 (four) times daily as needed (arm pain). Apply to left upper arm as needed 11/26/22   Mayer, Jodi R, NP  empagliflozin  (JARDIANCE ) 25 MG TABS tablet Take by mouth daily.    [provider]  gabapentin  (NEURONTIN ) 300 MG capsule Take 1 capsule (300 mg total) by mouth 3 (three) times daily. 03/07/23   Christopher Savannah, PA-C  glimepiride  (AMARYL ) 2 MG tablet Take 1 tablet (2 mg total) by mouth every morning for 14 days. 01/15/23 01/29/23  Teresa Shelba SAUNDERS, NP  glipiZIDE (GLUCOTROL XL) 10 MG 24 hr tablet Take 1 tablet by mouth daily. 01/18/23   [provider]  glucose blood (ACCU-CHEK GUIDE) test strip Use as instructed up to 4 times daily 04/07/21   Hongalgi, Anand D, MD  hydrochlorothiazide  (HYDRODIURIL ) 25 MG tablet Take 1 tablet (25 mg total) by  mouth daily for 14 days. Take one tablet daily 09/22/23 10/06/23  Palumbo, April, MD  ketoconazole  (NIZORAL ) 2 % cream Apply 1 Application topically daily. 03/07/23   Christopher Savannah, PA-C  leptospermum manuka honey (MEDIHONEY) PSTE paste Apply 1 Application topically daily. Apply to right great toe chronic wound after cleansing. Top with dry gauze, secure with conform bandaging, paper tape. Apply thin layer (3 mm) to wound. 07/04/22   Drusilla Sabas RAMAN, MD  levofloxacin (LEVAQUIN) 750 MG tablet Take by mouth.    [provider]  meloxicam  (MOBIC ) 7.5 MG tablet Take 1 tablet (7.5 mg total) by mouth daily. 01/15/23   White, Shelba SAUNDERS, NP  moxifloxacin  (VIGAMOX ) 0.5 % ophthalmic solution Place 1 drop into the right eye 3 (three) times daily. 09/22/23   Palumbo, April, MD  Multiple Vitamin (MULTIVITAMIN) capsule Take 1 capsule by mouth daily.    [provider]  nitrofurantoin , macrocrystal-monohydrate, (MACROBID ) 100 MG capsule Take 1 capsule (100 mg total) by mouth 2 (two) times daily. 03/09/23   Blaise Aleene KIDD, MD  omeprazole (PRILOSEC) 20 MG capsule Take 20 mg by mouth daily as needed (acid reflux).    [provider]  triamcinolone  cream (KENALOG ) 0.1 % Apply 1 Application topically 2 (two) times daily. 01/15/23   Teresa Shelba SAUNDERS, NP    Allergies: Amlodipine -valsartan-hctz, Egg-derived products, Mango flavoring agent (non-screening), Tetracyclines & related, Demeclocycline, and Tape    Review of Systems  Eyes:  Positive for pain.    Updated Vital Signs BP (!) 147/96   Pulse 89   Temp (!) 97.4 F (36.3 C)   Resp 18   Ht 5' 6 (1.676 m)   Wt 91.2 kg   SpO2 99%   BMI 32.44 kg/m   Physical Exam Vitals and nursing note reviewed.  Constitutional:      General: She is not in acute distress.    Appearance: Normal appearance. She is not diaphoretic.  HENT:     Head: Normocephalic and atraumatic.     Comments: No hematoma nor TTP of cranium No crepitus to facial bones     Right Ear: External ear normal. No hemotympanum.     Left Ear: External ear normal. No hemotympanum.     Nose: Nose normal.     Right Nostril: No epistaxis or septal hematoma.     Left Nostril: No epistaxis or septal hematoma.     Mouth/Throat:     Mouth: Mucous membranes are moist. No injury or lacerations.  Eyes:     General: Visual field deficit present.        Right eye: No foreign body or discharge.        Left eye: No foreign body or discharge.     Intraocular pressure: Right eye pressure is 12 mmHg.     Extraocular Movements: Extraocular movements intact.  Right eye: Normal extraocular motion and no nystagmus.     Left eye: Normal extraocular motion and no nystagmus.     Conjunctiva/sclera: Conjunctivae normal.     Right eye: Right conjunctiva is not injected. No chemosis.    Left eye: Left conjunctiva is not injected. No chemosis.    Pupils: Pupils are equal, round, and reactive to light.     Right eye: No corneal abrasion or fluorescein  uptake. Seidel exam negative.     Comments: No subconjunctival hemorrhage, hyphema, tear drop pupil, or fluid leakage bilaterally VA L 10/22. R unable to see. Bilateral 10/16  Neck:     Vascular: No carotid bruit.  Cardiovascular:     Rate and Rhythm: Normal rate.     Pulses: Normal pulses.          Radial pulses are 2+ on the right side and 2+ on the left side.       Dorsalis pedis pulses are 2+ on the right side and 2+ on the left side.  Pulmonary:     Effort: Pulmonary effort is normal. No respiratory distress.     Breath sounds: Normal breath sounds. No wheezing.  Chest:     Chest wall: No tenderness.  Abdominal:     General: Bowel sounds are normal. There is no distension.     Palpations: Abdomen is soft.     Tenderness: There is no abdominal tenderness. There is no guarding or rebound.  Musculoskeletal:     Cervical back: Full passive range of motion without pain, normal range of motion and neck supple. No deformity, rigidity  or bony tenderness. Normal range of motion.     Thoracic back: No deformity or bony tenderness. Normal range of motion.     Lumbar back: No deformity or bony tenderness. Normal range of motion.     Right hip: No bony tenderness or crepitus.     Left hip: No bony tenderness or crepitus.     Right lower leg: No edema.     Left lower leg: No edema.     Comments: No obvious deformity to joints or long bones Pelvis stable with no shortening or rotation of LE bilaterally  Skin:    General: Skin is warm and dry.     Capillary Refill: Capillary refill takes less than 2 seconds.  Neurological:     Mental Status: She is alert and oriented to person, place, and time. Mental status is at baseline.     GCS: GCS eye subscore is 4. GCS verbal subscore is 5. GCS motor subscore is 6.     Cranial Nerves: No cranial nerve deficit, dysarthria or facial asymmetry.     Sensory: Sensation is intact. No sensory deficit.     Motor: Motor function is intact. No weakness, tremor, abnormal muscle tone, seizure activity or pronator drift.     Coordination: Coordination is intact. Coordination normal. Finger-Nose-Finger Test and Heel to [facility] Test normal.     Gait: Gait is intact. Gait normal.     Deep Tendon Reflexes: Reflexes are normal and symmetric. Reflexes normal.     Comments: following commands appropriately     (all labs ordered are listed, but only abnormal results are displayed) Labs Reviewed  CBC WITH DIFFERENTIAL/PLATELET - Abnormal; Notable for the following components:      Result Value   Hemoglobin 11.2 (*)    MCV 74.9 (*)    MCH 22.7 (*)    All other components within normal limits  COMPREHENSIVE METABOLIC PANEL WITH GFR - Abnormal; Notable for the following components:   Glucose, Bld 220 (*)    BUN 28 (*)    Creatinine, Ser 1.18 (*)    Albumin 3.4 (*)    GFR, Estimated 53 (*)    All other components within normal limits    EKG: None  Radiology: CT Head Wo Contrast Result Date:  05/02/2024 CLINICAL DATA:  Head trauma, moderate-severe; blurred vision right eye Blurry vision in right eye. EXAM: CT HEAD AND ORBITS WITHOUT CONTRAST TECHNIQUE: Contiguous axial images were obtained from the base of the skull through the vertex without contrast. Multidetector CT imaging of the orbits was performed using the standard protocol without intravenous contrast. RADIATION DOSE REDUCTION: This exam was performed according to the departmental dose-optimization program which includes automated exposure control, adjustment of the mA and/or kV according to patient size and/or use of iterative reconstruction technique. COMPARISON:  Orbital CT 09/22/2023 FINDINGS: CT HEAD FINDINGS Brain: No intracranial hemorrhage, mass effect, or midline shift. No hydrocephalus. The basilar cisterns are patent. No evidence of territorial infarct or acute ischemia. No extra-axial or intracranial fluid collection. Vascular: Atherosclerosis of skullbase vasculature without hyperdense vessel or abnormal calcification. Skull: No fracture or focal lesion. Other: Mastoid air cells are clear. CT ORBITS FINDINGS Orbits: No traumatic or inflammatory finding. Globes, optic nerves, orbital fat, extraocular muscles, vascular structures, and lacrimal glands are normal. Bilateral lens resection. Visualized sinuses: Clear. Soft tissues: Negative. IMPRESSION: 1. No acute intracranial abnormality.  No skull fracture. 2. No acute orbital abnormality. Electronically Signed   By: Andrea Gasman M.D.   On: 05/02/2024 17:52   CT Orbits Wo Contrast Result Date: 05/02/2024 CLINICAL DATA:  Head trauma, moderate-severe; blurred vision right eye Blurry vision in right eye. EXAM: CT HEAD AND ORBITS WITHOUT CONTRAST TECHNIQUE: Contiguous axial images were obtained from the base of the skull through the vertex without contrast. Multidetector CT imaging of the orbits was performed using the standard protocol without intravenous contrast. RADIATION DOSE  REDUCTION: This exam was performed according to the departmental dose-optimization program which includes automated exposure control, adjustment of the mA and/or kV according to patient size and/or use of iterative reconstruction technique. COMPARISON:  Orbital CT 09/22/2023 FINDINGS: CT HEAD FINDINGS Brain: No intracranial hemorrhage, mass effect, or midline shift. No hydrocephalus. The basilar cisterns are patent. No evidence of territorial infarct or acute ischemia. No extra-axial or intracranial fluid collection. Vascular: Atherosclerosis of skullbase vasculature without hyperdense vessel or abnormal calcification. Skull: No fracture or focal lesion. Other: Mastoid air cells are clear. CT ORBITS FINDINGS Orbits: No traumatic or inflammatory finding. Globes, optic nerves, orbital fat, extraocular muscles, vascular structures, and lacrimal glands are normal. Bilateral lens resection. Visualized sinuses: Clear. Soft tissues: Negative. IMPRESSION: 1. No acute intracranial abnormality.  No skull fracture. 2. No acute orbital abnormality. Electronically Signed   By: Andrea Gasman M.D.   On: 05/02/2024 17:52     Ultrasound ED Ocular  Date/Time: 05/02/2024 6:46 PM  Performed by: Minnie Tinnie BRAVO, PA Authorized by: Minnie Tinnie BRAVO, PA   PROCEDURE DETAILS:    Indications: eye pain, eye trauma and visual change     Assessed:  Right eye   Images: archived   RIGHT EYE FINDINGS:     no foreign body noted in right eye    Medications Ordered in the ED  ofloxacin  (OCUFLOX ) 0.3 % ophthalmic solution 1 drop (has no administration in time range)  fluorescein  ophthalmic strip 1 strip (1 strip Both  Eyes Given 05/02/24 1716)  tetracaine  (PONTOCAINE) 0.5 % ophthalmic solution 2 drop (2 drops Both Eyes Given 05/02/24 1600)  amLODipine  (NORVASC ) tablet 10 mg (10 mg Oral Given 05/02/24 1736)                                    Medical Decision Making Amount and/or Complexity of Data Reviewed Labs:  ordered. Radiology: ordered.  Risk Prescription drug management.   Patient presents to the ED for concern of right eye blurred vision, head injury, HTN, this involves an extensive number of treatment options, and is a complaint that carries with it a high risk of complications and morbidity.  The differential diagnosis includes pretense of emergency, migraine, vitreous hemorrhage, retinal detachment, FB, corneal abrasion, conjunctivitis, ICH, CVA/TIA   Co morbidities that complicate the patient evaluation  Previous cataract surgery 2024   Additional history obtained:  Additional history obtained from  Nursing   External records from outside source obtained and reviewed including triage note, ED note from 2024 with similar symptoms   Lab Tests:  I Ordered, and personally interpreted labs.  The pertinent results include:   CBG 220 Creatinine 1.18 BUN 20 Hgb 11.2   Imaging Studies ordered:  I ordered imaging studies including CT head and orbits wo con  I independently visualized and interpreted imaging which showed no acute intracranial nor intraorbital abnormalities I agree with the radiologist interpretation     Medicines ordered and prescription drug management:  I ordered medication including ofloxacin   for possible eye abrasion  Reevaluation of the patient after these medicines showed that the patient stayed the same I have reviewed the patients home medicines and have made adjustments as needed     Consultations Obtained:  I requested consultation with ophthalmology Dr. Lavonia,  and discussed lab and imaging findings as well as pertinent plan - they recommend: Seeing patient tonight at [facility] ophthalmology office at 8 PM [phone]   Problem List / ED Course:  HTN Follows [facility] PCP who prescribed losartan and amlodipine  for HTN Does not believe that she took amlodipine  today so provided her dose at home decreasing BP to 150SBP. Blurred  vision continued despite BP improvement No elevated creatinine, ICH Provided amlodipine  improving BP to 150SBP Right eye blurred vision, floaters No motor nor sensory deficits on exam. No AMS nor HA. Low suspicion for CVA/TIA VA unable to be obtain in right eye d/t blurred vision EOM intact and no sign of entrapment. No pain with EOM but reported pain with laying supine in right eye No fluorescein  uptake but will treat for possible abrasion with abx drops CT orbits wo abnormalities Did occur in 2024 but unfortunately do not know what ophtho recommended then Concern for vitreous hemorrhage, retinal detachment so will have her follow up witg ophtho tonight in their office- info provided on DC paperwork Head injury No LOC nor thinners No signs of significant head trauma on exam. No signs of basilar skull fracture However, with blurred vision and no read etiology of head injury d/t patient being poor historian obtained CT head with fortunately was unremarkable     Reevaluation:  After the interventions noted above, I reevaluated the patient and found that they have :improved   Social Determinants of Health:  Has PCP   Dispostion:  After consideration of the diagnostic results and the patients response to treatment, I feel that the patent would benefit from outpatient  management with ophtho f/u tonight.  Discussed ED workup, disposition, return to ED precautions with patient who expresses understanding agrees with plan.  All questions answered to their satisfaction.  They are agreeable to plan.  Discharge instructions provided on paperwork  Final diagnoses:  Vision blurred  Hypertension, unspecified type    ED Discharge Orders     None          Minnie Tinnie BRAVO, PA 05/02/24 SULEMA    Armenta Canning, MD 05/10/24 1806

## 2024-05-02 NOTE — Discharge Instructions (Addendum)
 Thank for let us  evaluate you today.  Your CT of your head and eyes were negative for abnormalities.  Please follow-up with [facility] ophthalmology at [address] at 8 PM tonight with Dr. Lavonia for further evaluation of blurred vision. His # is [phone]  Make sure to take blood pressure medication as prescribed and do not forget to take it  Return to emergency department if you experience numbness or weakness in one-sided body, slurred speech, altered mentation, seizure-like activity, loss of consciousness

## 2024-05-02 NOTE — ED Notes (Signed)
 Patient transported to CT

## 2024-05-08 DIAGNOSIS — H33322 Round hole, left eye: Secondary | ICD-10-CM | POA: Diagnosis not present

## 2024-05-08 DIAGNOSIS — H43813 Vitreous degeneration, bilateral: Secondary | ICD-10-CM | POA: Diagnosis not present

## 2024-05-08 DIAGNOSIS — E113413 Type 2 diabetes mellitus with severe nonproliferative diabetic retinopathy with macular edema, bilateral: Secondary | ICD-10-CM | POA: Diagnosis not present

## 2024-05-08 DIAGNOSIS — H4311 Vitreous hemorrhage, right eye: Secondary | ICD-10-CM | POA: Diagnosis not present

## 2024-05-24 DIAGNOSIS — H4311 Vitreous hemorrhage, right eye: Secondary | ICD-10-CM | POA: Diagnosis not present

## 2024-05-24 DIAGNOSIS — E113511 Type 2 diabetes mellitus with proliferative diabetic retinopathy with macular edema, right eye: Secondary | ICD-10-CM | POA: Diagnosis not present

## 2024-05-24 DIAGNOSIS — E113412 Type 2 diabetes mellitus with severe nonproliferative diabetic retinopathy with macular edema, left eye: Secondary | ICD-10-CM | POA: Diagnosis not present

## 2024-05-24 DIAGNOSIS — H43813 Vitreous degeneration, bilateral: Secondary | ICD-10-CM | POA: Diagnosis not present

## 2024-05-28 ENCOUNTER — Ambulatory Visit: Payer: Self-pay | Admitting: *Deleted

## 2024-05-28 NOTE — Telephone Encounter (Signed)
  FYI Only or Action Required?: FYI only for provider.  Patient was last seen in primary care on 07/13/2021 by Shannan Sia I, NP.  Called Nurse Triage reporting Insect Bite.  Symptoms began yesterday.  Interventions attempted: OTC medications: vinegar.  Symptoms are: gradually worsening.  Triage Disposition: See HCP Within 4 Hours (Or PCP Triage)  Patient/caregiver understands and will follow disposition?: Unsure    Copied from CRM #[id number]. Topic: Clinical - Red Word Triage >> May 28, 2024  1:35 PM Franky GRADE wrote: Red Word that prompted transfer to Nurse Triage: Patient was bit by an insect yesterday and her lip has started to swell. She does not see a provider at any of the practices. Reason for Disposition  [1] SEVERE bite pain AND [2] not improved after 2 hours of pain medicine  Answer Assessment - Initial Assessment Questions Recommended UC / mobile bus. Gave location. Patient missed appt at [facility] this am and over slept.      1. TYPE of INSECT: What type of insect was it?      Not sure  2. ONSET: When did you get bitten?      Yesterday  3. LOCATION: Where is the insect bite located?   Under Bottom lip 4. REDNESS: Is the area red or pink? If Yes, ask: What size is the area of redness? (inches or cm). When did the redness start?     red 5. PAIN: Is there any pain? If Yes, ask: How bad is the pain? (Scale 0-10; or none, mild, moderate, severe)     Pain throbbing  6. ITCHING: Does it itch? If Yes, ask: How bad is the itch?      Yes  7. SWELLING: How big is the swelling? (e.g., inches, cm, or compare to coins)     Swelling 2 pin sizes' 8. OTHER SYMPTOMS: Do you have any other symptoms?  (e.g., difficulty breathing, fever, hives)     Pus to bug site. Looks like pimple. Pain throbbing 9. PREGNANCY: Is there any chance you are pregnant? When was your last menstrual period?     na  Protocols used: Insect Bite-A-AH

## 2024-05-29 DIAGNOSIS — E119 Type 2 diabetes mellitus without complications: Secondary | ICD-10-CM | POA: Diagnosis not present

## 2024-05-29 DIAGNOSIS — E559 Vitamin D deficiency, unspecified: Secondary | ICD-10-CM | POA: Diagnosis not present

## 2024-05-29 DIAGNOSIS — I10 Essential (primary) hypertension: Secondary | ICD-10-CM | POA: Diagnosis not present

## 2024-05-29 DIAGNOSIS — W57XXXA Bitten or stung by nonvenomous insect and other nonvenomous arthropods, initial encounter: Secondary | ICD-10-CM | POA: Diagnosis not present

## 2024-05-29 DIAGNOSIS — E7800 Pure hypercholesterolemia, unspecified: Secondary | ICD-10-CM | POA: Diagnosis not present

## 2024-05-29 DIAGNOSIS — R413 Other amnesia: Secondary | ICD-10-CM | POA: Diagnosis not present

## 2024-05-29 DIAGNOSIS — M51369 Other intervertebral disc degeneration, lumbar region without mention of lumbar back pain or lower extremity pain: Secondary | ICD-10-CM | POA: Diagnosis not present

## 2024-07-23 DIAGNOSIS — M792 Neuralgia and neuritis, unspecified: Secondary | ICD-10-CM | POA: Diagnosis not present

## 2024-07-23 DIAGNOSIS — M21961 Unspecified acquired deformity of right lower leg: Secondary | ICD-10-CM | POA: Diagnosis not present

## 2024-07-23 DIAGNOSIS — E1151 Type 2 diabetes mellitus with diabetic peripheral angiopathy without gangrene: Secondary | ICD-10-CM | POA: Diagnosis not present

## 2024-07-23 DIAGNOSIS — M722 Plantar fascial fibromatosis: Secondary | ICD-10-CM | POA: Diagnosis not present

## 2024-07-23 DIAGNOSIS — M21962 Unspecified acquired deformity of left lower leg: Secondary | ICD-10-CM | POA: Diagnosis not present

## 2024-08-29 ENCOUNTER — Telehealth: Payer: Self-pay | Admitting: Diagnostic Neuroimaging

## 2024-08-29 ENCOUNTER — Ambulatory Visit: Admitting: Diagnostic Neuroimaging

## 2024-08-29 NOTE — Telephone Encounter (Signed)
 Request to reschedule appointment, pt sick

## 2024-10-02 DIAGNOSIS — E119 Type 2 diabetes mellitus without complications: Secondary | ICD-10-CM | POA: Diagnosis not present

## 2024-10-02 DIAGNOSIS — E1129 Type 2 diabetes mellitus with other diabetic kidney complication: Secondary | ICD-10-CM | POA: Diagnosis not present

## 2024-10-02 DIAGNOSIS — R809 Proteinuria, unspecified: Secondary | ICD-10-CM | POA: Diagnosis not present

## 2024-10-02 DIAGNOSIS — R413 Other amnesia: Secondary | ICD-10-CM | POA: Diagnosis not present

## 2024-10-02 DIAGNOSIS — H9193 Unspecified hearing loss, bilateral: Secondary | ICD-10-CM | POA: Diagnosis not present

## 2024-10-02 DIAGNOSIS — E559 Vitamin D deficiency, unspecified: Secondary | ICD-10-CM | POA: Diagnosis not present

## 2024-10-02 DIAGNOSIS — E6609 Other obesity due to excess calories: Secondary | ICD-10-CM | POA: Diagnosis not present

## 2024-10-02 DIAGNOSIS — Z124 Encounter for screening for malignant neoplasm of cervix: Secondary | ICD-10-CM | POA: Diagnosis not present

## 2024-10-02 DIAGNOSIS — L97519 Non-pressure chronic ulcer of other part of right foot with unspecified severity: Secondary | ICD-10-CM | POA: Diagnosis not present

## 2024-10-02 DIAGNOSIS — E7800 Pure hypercholesterolemia, unspecified: Secondary | ICD-10-CM | POA: Diagnosis not present

## 2024-10-02 DIAGNOSIS — H6121 Impacted cerumen, right ear: Secondary | ICD-10-CM | POA: Diagnosis not present

## 2024-10-02 DIAGNOSIS — I10 Essential (primary) hypertension: Secondary | ICD-10-CM | POA: Diagnosis not present

## 2024-11-08 ENCOUNTER — Ambulatory Visit (INDEPENDENT_AMBULATORY_CARE_PROVIDER_SITE_OTHER)

## 2024-11-08 VITALS — Wt 201.0 lb

## 2024-11-08 DIAGNOSIS — H9313 Tinnitus, bilateral: Secondary | ICD-10-CM | POA: Diagnosis not present

## 2024-11-08 DIAGNOSIS — B369 Superficial mycosis, unspecified: Secondary | ICD-10-CM | POA: Diagnosis not present

## 2024-11-08 DIAGNOSIS — H9190 Unspecified hearing loss, unspecified ear: Secondary | ICD-10-CM

## 2024-11-08 DIAGNOSIS — H608X3 Other otitis externa, bilateral: Secondary | ICD-10-CM

## 2024-11-08 MED ORDER — CLOTRIMAZOLE-BETAMETHASONE 1-0.05 % EX CREA: 1.0000 | TOPICAL_CREAM | Freq: Every day | CUTANEOUS | 0 refills | Status: AC

## 2024-11-08 MED ORDER — FLUOCINOLONE ACETONIDE 0.01 % EX CREA: TOPICAL_CREAM | Freq: Two times a day (BID) | CUTANEOUS | 0 refills | Status: AC

## 2024-11-08 NOTE — Patient Instructions (Signed)
" °  VISIT SUMMARY: Today, you were seen for your progressive hearing loss, ear discomfort, and other related symptoms. We discussed your hearing loss, tinnitus, ear eczema, and a fungal infection in your left ear. We have outlined a plan to address each of these issues.  YOUR PLAN: -HEARING LOSS: You have been experiencing a gradual decline in your hearing, especially in your left ear. We have ordered an audiometric evaluation to better understand the extent and cause of your hearing loss.  -TINNITUS: You have ringing or cricket-like sounds in both ears. We will conduct further evaluations to determine the cause of your tinnitus.  -ECZEMATOUS DERMATITIS OF EXTERNAL EAR: You have chronic dry and flaky skin in your ears, which is contributing to your ear discomfort and discharge. We have prescribed a topical cream to help manage this condition.  -OTOMYCOSIS: You have a fungal infection in your left ear, which is contributing to your symptoms. This infection is not likely to significantly impact your hearing. A student examined your left ear as part of their training.  INSTRUCTIONS: Please schedule an audiometric evaluation as soon as possible. Use the prescribed topical cream for your ear eczema as directed. If you have any questions or if your symptoms worsen, please contact our office.                      Contains text generated by Abridge.                                 Contains text generated by Abridge.   "
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
## 2024-11-09 ENCOUNTER — Other Ambulatory Visit (INDEPENDENT_AMBULATORY_CARE_PROVIDER_SITE_OTHER): Payer: Self-pay

## 2024-11-13 ENCOUNTER — Telehealth: Payer: Self-pay | Admitting: Diagnostic Neuroimaging

## 2024-11-13 NOTE — Telephone Encounter (Signed)
 LVM with daughter and sent mychart message informing 3/30 appt needs to be rescheduled. MD out

## 2024-12-13 ENCOUNTER — Ambulatory Visit (INDEPENDENT_AMBULATORY_CARE_PROVIDER_SITE_OTHER): Admitting: Audiology

## 2024-12-13 ENCOUNTER — Ambulatory Visit (INDEPENDENT_AMBULATORY_CARE_PROVIDER_SITE_OTHER)

## 2025-01-28 ENCOUNTER — Ambulatory Visit: Admitting: Diagnostic Neuroimaging
